# Patient Record
Sex: Male | Born: 1950 | Race: White | Hispanic: No | Marital: Married | Smoking: Never smoker
Health system: Southern US, Community
[De-identification: ages and names within clinical notes are randomized; demographics above are authoritative.]

## PROBLEM LIST (undated history)

## (undated) DIAGNOSIS — T7840XA Allergy, unspecified, initial encounter: Secondary | ICD-10-CM

## (undated) DIAGNOSIS — H3581 Retinal edema: Secondary | ICD-10-CM

## (undated) DIAGNOSIS — E785 Hyperlipidemia, unspecified: Secondary | ICD-10-CM

## (undated) DIAGNOSIS — H269 Unspecified cataract: Secondary | ICD-10-CM

## (undated) DIAGNOSIS — I10 Essential (primary) hypertension: Secondary | ICD-10-CM

## (undated) DIAGNOSIS — D649 Anemia, unspecified: Secondary | ICD-10-CM

## (undated) HISTORY — DX: Anemia, unspecified: D64.9

## (undated) HISTORY — PX: EYE SURGERY: SHX253

## (undated) HISTORY — DX: Allergy, unspecified, initial encounter: T78.40XA

## (undated) HISTORY — DX: Essential (primary) hypertension: I10

## (undated) HISTORY — DX: Unspecified cataract: H26.9

## (undated) HISTORY — DX: Retinal edema: H35.81

## (undated) HISTORY — DX: Hyperlipidemia, unspecified: E78.5

## (undated) HISTORY — PX: COLONOSCOPY: SHX174

---

## 1996-05-30 HISTORY — PX: CHOLECYSTECTOMY: SHX55

## 1997-11-04 ENCOUNTER — Emergency Department (HOSPITAL_COMMUNITY): Admission: EM | Admit: 1997-11-04 | Discharge: 1997-11-04 | Payer: Self-pay | Admitting: Emergency Medicine

## 1998-03-15 ENCOUNTER — Emergency Department (HOSPITAL_COMMUNITY): Admission: EM | Admit: 1998-03-15 | Discharge: 1998-03-15 | Payer: Self-pay

## 1998-03-24 ENCOUNTER — Observation Stay (HOSPITAL_COMMUNITY): Admission: RE | Admit: 1998-03-24 | Discharge: 1998-03-25 | Payer: Self-pay

## 2001-02-16 ENCOUNTER — Ambulatory Visit (HOSPITAL_COMMUNITY): Admission: RE | Admit: 2001-02-16 | Discharge: 2001-02-16 | Payer: Self-pay | Admitting: *Deleted

## 2001-02-16 ENCOUNTER — Encounter (INDEPENDENT_AMBULATORY_CARE_PROVIDER_SITE_OTHER): Payer: Self-pay | Admitting: *Deleted

## 2008-05-07 ENCOUNTER — Encounter: Admission: RE | Admit: 2008-05-07 | Discharge: 2008-05-07 | Payer: Self-pay | Admitting: Internal Medicine

## 2008-05-13 ENCOUNTER — Ambulatory Visit: Payer: Self-pay | Admitting: *Deleted

## 2008-07-18 ENCOUNTER — Emergency Department (HOSPITAL_COMMUNITY): Admission: EM | Admit: 2008-07-18 | Discharge: 2008-07-18 | Payer: Self-pay | Admitting: Emergency Medicine

## 2010-09-14 LAB — URINALYSIS, ROUTINE W REFLEX MICROSCOPIC
Bilirubin Urine: NEGATIVE
Glucose, UA: NEGATIVE mg/dL
Hgb urine dipstick: NEGATIVE
Nitrite: NEGATIVE
Protein, ur: NEGATIVE mg/dL
Specific Gravity, Urine: 1.021 (ref 1.005–1.030)
Urobilinogen, UA: 0.2 mg/dL (ref 0.0–1.0)
pH: 5 (ref 5.0–8.0)

## 2010-10-12 NOTE — Procedures (Signed)
CAROTID DUPLEX EXAM   INDICATION:  Left retinal occlusion.   HISTORY:  Diabetes:  Yes.  Cardiac:  No.  Hypertension:  Yes.  Smoking:  No.  Previous Surgery:  CV History:  Amaurosis Fugax No, Paresthesias No, Hemiparesis No,                                       RIGHT             LEFT  Brachial systolic pressure:         140               130  Brachial Doppler waveforms:         Biphasic          Biphasic  Vertebral direction of flow:        Antegrade         Antegrade  DUPLEX VELOCITIES (cm/sec)  CCA peak systolic                   90                82  ECA peak systolic                   142               103  ICA peak systolic                   86                58  ICA end diastolic                   28                23  PLAQUE MORPHOLOGY:                  None              None  PLAQUE AMOUNT:                      None              None  PLAQUE LOCATION:                    None              None   IMPRESSION:  1. Normal carotid duplex noted bilaterally.  2. Antegrade bilateral vertebral arteries.       ___________________________________________  P. Liliane Bade, M.D.   MG/MEDQ  D:  05/14/2008  T:  05/14/2008  Job:  [id number]

## 2011-09-27 ENCOUNTER — Encounter: Payer: Self-pay | Admitting: Internal Medicine

## 2011-11-04 ENCOUNTER — Encounter: Payer: Self-pay | Admitting: Internal Medicine

## 2011-11-04 ENCOUNTER — Ambulatory Visit (AMBULATORY_SURGERY_CENTER): Payer: PRIVATE HEALTH INSURANCE | Admitting: *Deleted

## 2011-11-04 VITALS — Ht 70.0 in | Wt 215.0 lb

## 2011-11-04 DIAGNOSIS — Z1211 Encounter for screening for malignant neoplasm of colon: Secondary | ICD-10-CM

## 2011-11-04 MED ORDER — PEG-KCL-NACL-NASULF-NA ASC-C 100 G PO SOLR: ORAL | Status: DC

## 2011-11-18 ENCOUNTER — Ambulatory Visit (AMBULATORY_SURGERY_CENTER): Payer: PRIVATE HEALTH INSURANCE | Admitting: Internal Medicine

## 2011-11-18 ENCOUNTER — Encounter: Payer: Self-pay | Admitting: Internal Medicine

## 2011-11-18 VITALS — BP 144/70 | HR 73 | Temp 96.7°F | Resp 16 | Ht 70.0 in | Wt 215.0 lb

## 2011-11-18 DIAGNOSIS — D126 Benign neoplasm of colon, unspecified: Secondary | ICD-10-CM

## 2011-11-18 DIAGNOSIS — Z1211 Encounter for screening for malignant neoplasm of colon: Secondary | ICD-10-CM

## 2011-11-18 LAB — HM COLONOSCOPY

## 2011-11-18 MED ORDER — SODIUM CHLORIDE 0.9 % IV SOLN: 500.0000 mL | INTRAVENOUS | Status: DC

## 2011-11-18 NOTE — Patient Instructions (Addendum)
YOU HAD AN ENDOSCOPIC PROCEDURE TODAY AT THE [facility]: Refer to the procedure report that was given to you for any specific questions about what was found during the examination.  If the procedure report does not answer your questions, please call your gastroenterologist to clarify.  If you requested that your care partner not be given the details of your procedure findings, then the procedure report has been included in a sealed envelope for you to review at your convenience later.  YOU SHOULD EXPECT: Some feelings of bloating in the abdomen. Passage of more gas than usual.  Walking can help get rid of the air that was put into your GI tract during the procedure and reduce the bloating. If you had a lower endoscopy (such as a colonoscopy or flexible sigmoidoscopy) you may notice spotting of blood in your stool or on the toilet paper. If you underwent a bowel prep for your procedure, then you may not have a normal bowel movement for a few days.  DIET: Your first meal following the procedure should be a light meal and then it is ok to progress to your normal diet.  A half-sandwich or bowl of soup is an example of a good first meal.  Heavy or fried foods are harder to digest and may make you feel nauseous or bloated.  Likewise meals heavy in dairy and vegetables can cause extra gas to form and this can also increase the bloating.  Drink plenty of fluids but you should avoid alcoholic beverages for 24 hours.  ACTIVITY: Your care partner should take you home directly after the procedure.  You should plan to take it easy, moving slowly for the rest of the day.  You can resume normal activity the day after the procedure however you should NOT DRIVE or use heavy machinery for 24 hours (because of the sedation medicines used during the test).    SYMPTOMS TO REPORT IMMEDIATELY: A gastroenterologist can be reached at any hour.  During normal business hours, 8:30 AM to 5:00 PM Monday through Friday,  call [phone].  After hours and on weekends, please call the GI answering service at [phone] who will take a message and have the physician on call contact you.   Following lower endoscopy (colonoscopy or flexible sigmoidoscopy):  Excessive amounts of blood in the stool  Significant tenderness or worsening of abdominal pains  Swelling of the abdomen that is new, acute  Fever of 100F or higher  Following upper endoscopy (EGD)  Vomiting of blood or coffee ground material  New chest pain or pain under the shoulder blades  Painful or persistently difficult swallowing  New shortness of breath  Fever of 100F or higher  Black, tarry-looking stools  FOLLOW UP: If any biopsies were taken you will be contacted by phone or by letter within the next 1-3 weeks.  Call your gastroenterologist if you have not heard about the biopsies in 3 weeks.  Our staff will call the home number listed on your records the next business day following your procedure to check on you and address any questions or concerns that you may have at that time regarding the information given to you following your procedure. This is a courtesy call and so if there is no answer at the home number and we have not heard from you through the emergency physician on call, we will assume that you have returned to your regular daily activities without incident.  SIGNATURES/CONFIDENTIALITY: You and/or your care   partner have signed paperwork which will be entered into your electronic medical record.  These signatures attest to the fact that that the information above on your After Visit Summary has been reviewed and is understood.  Full responsibility of the confidentiality of this discharge information lies with you and/or your care-partner.  

## 2011-11-18 NOTE — Op Note (Signed)
[facility] [address] [place], [place]  [address]  COLONOSCOPY PROCEDURE REPORT  PATIENT:  Edward Macdonald, Edward Macdonald  MR#:  [id number] BIRTHDATE:  1951/02/06, 60 yrs. old  GENDER:  male ENDOSCOPIST:  Hedwig Morton. Juanda Chance, MD REF. BY:  Juline Patch, M.D. PROCEDURE DATE:  11/18/2011 PROCEDURE:  Colonoscopy with biopsy ASA CLASS:  Class II INDICATIONS:  colorectal cancer screening, average risk prior colon about 10 years ago ? polyps? MEDICATIONS:   MAC sedation, administered by CRNA, propofol (Diprivan) 150 mg  DESCRIPTION OF PROCEDURE:   After the risks and benefits and of the procedure were explained, informed consent was obtained. Digital rectal exam was performed and revealed no rectal masses. The LB [id number] endoscope was introduced through the anus and advanced to the cecum, which was identified by both the appendix and ileocecal valve.  The quality of the prep was good, using MoviPrep.  The instrument was then slowly withdrawn as the colon was fully examined. <<PROCEDUREIMAGES>>  FINDINGS:  A sessile polyp was found in the cecum. 3 mm polyp The polyp was removed using cold biopsy forceps (see image5).  Mild diverticulosis was found in the descending colon (see image7 and image1).  This was otherwise a normal examination of the colon (see image2, image3, image4, image6, and image8).   Retroflexed views in the rectum revealed no abnormalities.    The scope was then withdrawn from the patient and the procedure completed.  COMPLICATIONS:  None ENDOSCOPIC IMPRESSION: 1) Sessile polyp in the cecum 2) Mild diverticulosis in the descending colon 3) Otherwise normal examination RECOMMENDATIONS: 1) Await pathology results 2) High fiber diet.  REPEAT EXAM:  In 5 - 10 year(s) for.  5 year recall if polyp adenomatous  ______________________________ Hedwig Morton. Juanda Chance, MD  CC:  n. eSIGNED:   Hedwig Morton. Antania Hoefling at 11/18/2011 08:29 AM  Annie Paras, [phone]

## 2011-11-18 NOTE — Progress Notes (Signed)
Patient did not experience any of the following events: a burn prior to discharge; a fall within the facility; wrong site/side/patient/procedure/implant event; or a hospital transfer or hospital admission upon discharge from the facility. (G8907) Patient did not have preoperative order for IV antibiotic SSI prophylaxis. (G8918)  

## 2011-11-21 ENCOUNTER — Telehealth: Payer: Self-pay | Admitting: *Deleted

## 2011-11-21 NOTE — Telephone Encounter (Signed)
Message left for the patient

## 2011-11-22 ENCOUNTER — Encounter: Payer: Self-pay | Admitting: Internal Medicine

## 2012-04-10 LAB — HM DIABETES EYE EXAM

## 2012-06-21 ENCOUNTER — Encounter: Payer: Self-pay | Admitting: Internal Medicine

## 2012-06-21 ENCOUNTER — Ambulatory Visit (INDEPENDENT_AMBULATORY_CARE_PROVIDER_SITE_OTHER): Payer: PRIVATE HEALTH INSURANCE | Admitting: Internal Medicine

## 2012-06-21 ENCOUNTER — Other Ambulatory Visit (INDEPENDENT_AMBULATORY_CARE_PROVIDER_SITE_OTHER): Payer: PRIVATE HEALTH INSURANCE

## 2012-06-21 VITALS — BP 128/72 | HR 83 | Temp 97.3°F | Resp 16 | Ht 70.0 in | Wt 216.5 lb

## 2012-06-21 DIAGNOSIS — E1165 Type 2 diabetes mellitus with hyperglycemia: Secondary | ICD-10-CM

## 2012-06-21 DIAGNOSIS — E118 Type 2 diabetes mellitus with unspecified complications: Secondary | ICD-10-CM | POA: Insufficient documentation

## 2012-06-21 DIAGNOSIS — E7800 Pure hypercholesterolemia, unspecified: Secondary | ICD-10-CM

## 2012-06-21 DIAGNOSIS — E1139 Type 2 diabetes mellitus with other diabetic ophthalmic complication: Secondary | ICD-10-CM

## 2012-06-21 DIAGNOSIS — E785 Hyperlipidemia, unspecified: Secondary | ICD-10-CM | POA: Insufficient documentation

## 2012-06-21 DIAGNOSIS — Z0000 Encounter for general adult medical examination without abnormal findings: Secondary | ICD-10-CM

## 2012-06-21 DIAGNOSIS — I10 Essential (primary) hypertension: Secondary | ICD-10-CM | POA: Insufficient documentation

## 2012-06-21 DIAGNOSIS — E782 Mixed hyperlipidemia: Secondary | ICD-10-CM | POA: Insufficient documentation

## 2012-06-21 DIAGNOSIS — Z23 Encounter for immunization: Secondary | ICD-10-CM

## 2012-06-21 DIAGNOSIS — R0683 Snoring: Secondary | ICD-10-CM | POA: Insufficient documentation

## 2012-06-21 DIAGNOSIS — G4730 Sleep apnea, unspecified: Secondary | ICD-10-CM

## 2012-06-21 LAB — LIPID PANEL
Cholesterol: 143 mg/dL (ref 0–200)
LDL Cholesterol: 69 mg/dL (ref 0–99)
Triglycerides: 197 mg/dL — ABNORMAL HIGH (ref 0.0–149.0)
VLDL: 39.4 mg/dL (ref 0.0–40.0)

## 2012-06-21 LAB — CBC WITH DIFFERENTIAL/PLATELET
Basophils Relative: 0.5 % (ref 0.0–3.0)
Eosinophils Relative: 2.2 % (ref 0.0–5.0)
HCT: 43.5 % (ref 39.0–52.0)
Hemoglobin: 15 g/dL (ref 13.0–17.0)
Lymphs Abs: 2 10*3/uL (ref 0.7–4.0)
Monocytes Relative: 6 % (ref 3.0–12.0)
Neutro Abs: 4 10*3/uL (ref 1.4–7.7)
RBC: 4.81 Mil/uL (ref 4.22–5.81)
WBC: 6.6 10*3/uL (ref 4.5–10.5)

## 2012-06-21 LAB — COMPREHENSIVE METABOLIC PANEL
Albumin: 4.2 g/dL (ref 3.5–5.2)
BUN: 17 mg/dL (ref 6–23)
CO2: 28 mEq/L (ref 19–32)
Calcium: 9.9 mg/dL (ref 8.4–10.5)
Chloride: 103 mEq/L (ref 96–112)
Glucose, Bld: 114 mg/dL — ABNORMAL HIGH (ref 70–99)
Potassium: 4.1 mEq/L (ref 3.5–5.1)
Sodium: 139 mEq/L (ref 135–145)
Total Protein: 7.3 g/dL (ref 6.0–8.3)

## 2012-06-21 LAB — PSA: PSA: 0.28 ng/mL (ref 0.10–4.00)

## 2012-06-21 LAB — FECAL OCCULT BLOOD, GUAIAC: Fecal Occult Blood: NEGATIVE

## 2012-06-21 LAB — URINALYSIS, ROUTINE W REFLEX MICROSCOPIC
Bilirubin Urine: NEGATIVE
Leukocytes, UA: NEGATIVE
Specific Gravity, Urine: 1.015 (ref 1.000–1.030)
Urine Glucose: NEGATIVE
Urobilinogen, UA: 0.2 (ref 0.0–1.0)

## 2012-06-21 LAB — MICROALBUMIN / CREATININE URINE RATIO
Microalb Creat Ratio: 0.2 mg/g (ref 0.0–30.0)
Microalb, Ur: 0.2 mg/dL (ref 0.0–1.9)

## 2012-06-21 LAB — HEMOGLOBIN A1C: Hgb A1c MFr Bld: 7.1 % — ABNORMAL HIGH (ref 4.6–6.5)

## 2012-06-21 LAB — HM DIABETES FOOT EXAM: HM Diabetic Foot Exam: NORMAL

## 2012-06-21 LAB — TSH: TSH: 1.92 u[IU]/mL (ref 0.35–5.50)

## 2012-06-21 MED ORDER — METFORMIN HCL 500 MG PO TABS: 500.0000 mg | ORAL_TABLET | Freq: Two times a day (BID) | ORAL | Status: DC

## 2012-06-21 MED ORDER — INSULIN PEN NEEDLE 31G X 8 MM MISC: 1.0000 | Freq: Three times a day (TID) | Status: DC

## 2012-06-21 MED ORDER — VALSARTAN-HYDROCHLOROTHIAZIDE 160-12.5 MG PO TABS: 1.0000 | ORAL_TABLET | Freq: Every day | ORAL | Status: DC

## 2012-06-21 MED ORDER — ONETOUCH VERIO IQ SYSTEM W/DEVICE KIT: 1.0000 | PACK | Freq: Three times a day (TID) | Status: DC

## 2012-06-21 MED ORDER — BISOPROLOL FUMARATE 5 MG PO TABS: 5.0000 mg | ORAL_TABLET | Freq: Every day | ORAL | Status: DC

## 2012-06-21 MED ORDER — ROSUVASTATIN CALCIUM 10 MG PO TABS: 10.0000 mg | ORAL_TABLET | Freq: Every day | ORAL | Status: DC

## 2012-06-21 MED ORDER — INSULIN ASPART PROT & ASPART (70-30 MIX) 100 UNIT/ML ~~LOC~~ SUSP: 60.0000 [IU] | Freq: Two times a day (BID) | SUBCUTANEOUS | Status: DC

## 2012-06-21 MED ORDER — GLUCOSE BLOOD VI STRP: ORAL_STRIP | Status: DC

## 2012-06-21 NOTE — Addendum Note (Signed)
Addended by: Etta Grandchild on: 06/21/2012 02:13 PM   Modules accepted: Orders

## 2012-06-21 NOTE — Assessment & Plan Note (Signed)
I will check his a1c and will make changes in his meds of needed I will check his renal function He will start a statin

## 2012-06-21 NOTE — Assessment & Plan Note (Addendum)
Exam done His EKG is normal Vaccines were reviewed and updated Labs ordered Pt ed material was given

## 2012-06-21 NOTE — Assessment & Plan Note (Signed)
Referral to sleep med at his request

## 2012-06-21 NOTE — Patient Instructions (Signed)
Diabetes, Type 2 Diabetes is a long-lasting (chronic) disease. In type 2 diabetes, the pancreas does not make enough insulin (a hormone), and the body does not respond normally to the insulin that is made. This type of diabetes was also previously called adult-onset diabetes. It usually occurs after the age of 40, but it can occur at any age.  CAUSES  Type 2 diabetes happens because the pancreasis not making enough insulin or your body has trouble using the insulin that your pancreas does make properly. SYMPTOMS   Drinking more than usual.  Urinating more than usual.  Blurred vision.  Dry, itchy skin.  Frequent infections.  Feeling more tired than usual (fatigue). DIAGNOSIS The diagnosis of type 2 diabetes is usually made by one of the following tests:  Fasting blood glucose test. You will not eat for at least 8 hours and then take a blood test.  Random blood glucose test. Your blood glucose (sugar) is checked at any time of the day regardless of when you ate.  Oral glucose tolerance test (OGTT). Your blood glucose is measured after you have not eaten (fasted) and then after you drink a glucose containing beverage. TREATMENT   Healthy eating.  Exercise.  Medicine, if needed.  Monitoring blood glucose.  Seeing your caregiver regularly. HOME CARE INSTRUCTIONS   Check your blood glucose at least once a day. More frequent monitoring may be necessary, depending on your medicines and on how well your diabetes is controlled. Your caregiver will advise you.  Take your medicine as directed by your caregiver.  Do not smoke.  Make wise food choices. Ask your caregiver for information. Weight loss can improve your diabetes.  Learn about low blood glucose (hypoglycemia) and how to treat it.  Get your eyes checked regularly.  Have a yearly physical exam. Have your blood pressure checked and your blood and urine tested.  Wear a pendant or bracelet saying that you have  diabetes.  Check your feet every night for cuts, sores, blisters, and redness. Let your caregiver know if you have any problems. SEEK MEDICAL CARE IF:   You have problems keeping your blood glucose in target range.  You have problems with your medicines.  You have symptoms of an illness that do not improve after 24 hours.  You have a sore or wound that is not healing.  You notice a change in vision or a new problem with your vision.  You have a fever. MAKE SURE YOU:  Understand these instructions.  Will watch your condition.  Will get help right away if you are not doing well or get worse. Document Released: 05/16/2005 Document Revised: 08/08/2011 Document Reviewed: 11/01/2010 Exit[employer] Patient Information 2013 [employer]. Health Maintenance, Males A healthy lifestyle and preventative care can promote health and wellness.  Maintain regular health, dental, and eye exams.  Eat a healthy diet. Foods like vegetables, fruits, whole grains, low-fat dairy products, and lean protein foods contain the nutrients you need without too many calories. Decrease your intake of foods high in solid fats, added sugars, and salt. Get information about a proper diet from your caregiver, if necessary.  Regular physical exercise is one of the most important things you can do for your health. Most adults should get at least 150 minutes of moderate-intensity exercise (any activity that increases your heart rate and causes you to sweat) each week. In addition, most adults need muscle-strengthening exercises on 2 or more days a week.   Maintain a healthy weight. The   body mass index (BMI) is a screening tool to identify possible weight problems. It provides an estimate of body fat based on height and weight. Your caregiver can help determine your BMI, and can help you achieve or maintain a healthy weight. For adults 20 years and older:  A BMI below 18.5 is considered underweight.  A BMI of 18.5 to  24.9 is normal.  A BMI of 25 to 29.9 is considered overweight.  A BMI of 30 and above is considered obese.  Maintain normal blood lipids and cholesterol by exercising and minimizing your intake of saturated fat. Eat a balanced diet with plenty of fruits and vegetables. Blood tests for lipids and cholesterol should begin at age 20 and be repeated every 5 years. If your lipid or cholesterol levels are high, you are over 50, or you are a high risk for heart disease, you may need your cholesterol levels checked more frequently.Ongoing high lipid and cholesterol levels should be treated with medicines, if diet and exercise are not effective.  If you smoke, find out from your caregiver how to quit. If you do not use tobacco, do not start.  If you choose to drink alcohol, do not exceed 2 drinks per day. One drink is considered to be 12 ounces (355 mL) of beer, 5 ounces (148 mL) of wine, or 1.5 ounces (44 mL) of liquor.  Avoid use of street drugs. Do not share needles with anyone. Ask for help if you need support or instructions about stopping the use of drugs.  High blood pressure causes heart disease and increases the risk of stroke. Blood pressure should be checked at least every 1 to 2 years. Ongoing high blood pressure should be treated with medicines if weight loss and exercise are not effective.  If you are 45 to 62 years old, ask your caregiver if you should take aspirin to prevent heart disease.  Diabetes screening involves taking a blood sample to check your fasting blood sugar level. This should be done once every 3 years, after age 45, if you are within normal weight and without risk factors for diabetes. Testing should be considered at a younger age or be carried out more frequently if you are overweight and have at least 1 risk factor for diabetes.  Colorectal cancer can be detected and often prevented. Most routine colorectal cancer screening begins at the age of 50 and continues through  age 75. However, your caregiver may recommend screening at an earlier age if you have risk factors for colon cancer. On a yearly basis, your caregiver may provide home test kits to check for hidden blood in the stool. Use of a small camera at the end of a tube, to directly examine the colon (sigmoidoscopy or colonoscopy), can detect the earliest forms of colorectal cancer. Talk to your caregiver about this at age 50, when routine screening begins. Direct examination of the colon should be repeated every 5 to 10 years through age 75, unless early forms of pre-cancerous polyps or small growths are found.  Hepatitis C blood testing is recommended for all people born from 1945 through 1965 and any individual with known risks for hepatitis C.  Healthy men should no longer receive prostate-specific antigen (PSA) blood tests as part of routine cancer screening. Consult with your caregiver about prostate cancer screening.  Testicular cancer screening is not recommended for adolescents or adult males who have no symptoms. Screening includes self-exam, caregiver exam, and other screening tests. Consult with your caregiver   about any symptoms you have or any concerns you have about testicular cancer.  Practice safe sex. Use condoms and avoid high-risk sexual practices to reduce the spread of sexually transmitted infections (STIs).  Use sunscreen with a sun protection factor (SPF) of 30 or greater. Apply sunscreen liberally and repeatedly throughout the day. You should seek shade when your shadow is shorter than you. Protect yourself by wearing long sleeves, pants, a wide-brimmed hat, and sunglasses year round, whenever you are outdoors.  Notify your caregiver of new moles or changes in moles, especially if there is a change in shape or color. Also notify your caregiver if a mole is larger than the size of a pencil eraser.  A one-time screening for abdominal aortic aneurysm (AAA) and surgical repair of large AAAs  by sound wave imaging (ultrasonography) is recommended for ages 65 to 75 years who are current or former smokers.  Stay current with your immunizations. Document Released: 11/12/2007 Document Revised: 08/08/2011 Document Reviewed: 10/11/2010 Exit[employer] Patient Information 2013 [employer].  

## 2012-06-21 NOTE — Assessment & Plan Note (Signed)
He agrees to start crestor

## 2012-06-21 NOTE — Assessment & Plan Note (Signed)
His BP is well controlled I will check his lytes and renal function today 

## 2012-06-21 NOTE — Progress Notes (Signed)
Subjective:    Patient ID: Edward Macdonald, male    DOB: [birth year], 62 y.o.   MRN: [id number]  Diabetes He presents for his follow-up diabetic visit. He has type 2 diabetes mellitus. The initial diagnosis of diabetes was made 18 years ago. His disease course has been fluctuating. There are no hypoglycemic associated symptoms. Pertinent negatives for hypoglycemia include no dizziness, headaches, seizures, speech difficulty or tremors. Pertinent negatives for diabetes include no blurred vision, no chest pain, no fatigue, no foot paresthesias, no foot ulcerations, no polydipsia, no polyphagia, no polyuria, no visual change, no weakness and no weight loss. There are no hypoglycemic complications. There are no diabetic complications. Current diabetic treatment includes oral agent (monotherapy), intensive insulin program and insulin injections. He is compliant with treatment most of the time. His weight is stable. He is following a generally healthy diet. Meal planning includes avoidance of concentrated sweets. He never participates in exercise. His breakfast blood glucose range is generally 110-130 mg/dl. His lunch blood glucose range is generally 140-180 mg/dl. His dinner blood glucose range is generally 180-200 mg/dl. His highest blood glucose is 180-200 mg/dl. His overall blood glucose range is 180-200 mg/dl. An ACE inhibitor/angiotensin II receptor blocker is being taken. He does not see a podiatrist.Eye exam is current.      Review of Systems  Constitutional: Negative for fever, chills, weight loss, diaphoresis, activity change, appetite change, fatigue and unexpected weight change.  HENT: Negative.   Eyes: Negative.  Negative for blurred vision.  Respiratory: Positive for apnea. Negative for cough, choking, chest tightness, shortness of breath, wheezing and stridor.   Cardiovascular: Negative for chest pain, palpitations and leg swelling.  Gastrointestinal: Negative for nausea, vomiting, abdominal  pain, diarrhea, constipation and blood in stool.  Genitourinary: Negative for dysuria, urgency, polyuria, frequency, hematuria, flank pain, decreased urine volume, discharge, penile swelling, scrotal swelling, enuresis, difficulty urinating, genital sores, penile pain and testicular pain.  Musculoskeletal: Negative for myalgias, back pain, joint swelling, arthralgias and gait problem.  Skin: Negative.   Neurological: Positive for numbness (in both feet). Negative for dizziness, tremors, seizures, syncope, facial asymmetry, speech difficulty, weakness, light-headedness and headaches.  Hematological: Negative for polydipsia, polyphagia and adenopathy. Does not bruise/bleed easily.  Psychiatric/Behavioral: Negative.        Objective:   Physical Exam  Vitals reviewed. Constitutional: He is oriented to person, place, and time. He appears well-developed and well-nourished. No distress.  HENT:  Head: Normocephalic and atraumatic.  Mouth/Throat: Oropharynx is clear and moist. No oropharyngeal exudate.  Eyes: Conjunctivae normal are normal. Right eye exhibits no discharge. Left eye exhibits no discharge. No scleral icterus.  Neck: Normal range of motion. Neck supple. No JVD present. No tracheal deviation present. No thyromegaly present.  Cardiovascular: Normal rate, regular rhythm, normal heart sounds and intact distal pulses.  Exam reveals no gallop and no friction rub.   No murmur heard. Pulmonary/Chest: Effort normal and breath sounds normal. No stridor. No respiratory distress. He has no wheezes. He has no rales. He exhibits no tenderness.  Abdominal: Soft. Bowel sounds are normal. He exhibits no distension and no mass. There is no tenderness. There is no rebound and no guarding. Hernia confirmed negative in the right inguinal area and confirmed negative in the left inguinal area.  Genitourinary: Rectum normal, prostate normal, testes normal and penis normal. Rectal exam shows no external  hemorrhoid, no internal hemorrhoid, no fissure, no mass, no tenderness and anal tone normal. Guaiac negative stool. Prostate is not enlarged and  not tender. Right testis shows no mass, no swelling and no tenderness. Right testis is descended. Left testis shows no mass, no swelling and no tenderness. Left testis is descended. Circumcised. No penile erythema or penile tenderness. No discharge found.  Musculoskeletal: Normal range of motion. He exhibits no edema and no tenderness.  Lymphadenopathy:    He has no cervical adenopathy.       Right: No inguinal adenopathy present.       Left: No inguinal adenopathy present.  Neurological: He is alert and oriented to person, place, and time. He has normal reflexes. He displays normal reflexes. No cranial nerve deficit. He exhibits normal muscle tone. Coordination normal.  Skin: Skin is warm and dry. No rash noted. He is not diaphoretic. No erythema. No pallor.  Psychiatric: He has a normal mood and affect. His behavior is normal. Judgment and thought content normal.      No results found for this basename: WBC, HGB, HCT, PLT, GLUCOSE, CHOL, TRIG, HDL, LDLDIRECT, LDLCALC, ALT, AST, NA, K, CL, CREATININE, BUN, CO2, TSH, PSA, INR, GLUF, HGBA1C, MICROALBUR      Assessment & Plan:

## 2012-07-05 ENCOUNTER — Ambulatory Visit (INDEPENDENT_AMBULATORY_CARE_PROVIDER_SITE_OTHER): Payer: PRIVATE HEALTH INSURANCE | Admitting: Pulmonary Disease

## 2012-07-05 ENCOUNTER — Encounter: Payer: Self-pay | Admitting: Pulmonary Disease

## 2012-07-05 VITALS — BP 122/78 | HR 80 | Temp 98.2°F | Ht 70.0 in | Wt 221.0 lb

## 2012-07-05 DIAGNOSIS — R0683 Snoring: Secondary | ICD-10-CM

## 2012-07-05 DIAGNOSIS — R0989 Other specified symptoms and signs involving the circulatory and respiratory systems: Secondary | ICD-10-CM

## 2012-07-05 DIAGNOSIS — G4730 Sleep apnea, unspecified: Secondary | ICD-10-CM

## 2012-07-05 NOTE — Assessment & Plan Note (Signed)
The patient has a retinal disease that may be influenced by nocturnal hypoxemia, and therefore the question is been raised whether he may have sleep disordered breathing.  The patient is overweight with a large neck size, but really doesn't give a convincing history for sleep apnea.  The first question we really need to answer here is whether he is having nocturnal desaturation, and if not, I do not think I would pursue sleep disordered breathing any further unless he is having worsening symptoms (however, a normal ONO does not rule out the presence of OSA).  On the other hand, if he is having significant desaturations, I would proceed with sleep study for further evaluation.  The patient is agreeable to this approach.  I have encouraged him to work aggressively on weight loss.

## 2012-07-05 NOTE — Progress Notes (Signed)
  Subjective:    Patient ID: Edward Macdonald, male    DOB: Aug 09, 1950, 62 y.o.   MRN: [id number]  HPI The patient is a 62 year old male who I've been asked to see for possible obstructive sleep apnea.  The patient has a degenerative retinal disease, and the question was raised whether nocturnal hypoxemia could be contributing to this.  This led to the question of possible sleep apnea.  The patient has had some snoring noted, but no one has commented on an abnormal breathing pattern during sleep.  He denies any gasping arousals.  The patient does not have significant awakenings during the night, and feels rested in the mornings upon arising.  He denies any issues with sleepiness during the day, although he can have sleep pressure after lunch less than one time a week.  He has some sleepiness in the evening while watching television, but this is not consistent.  He has no issues with sleepiness while driving.  The patient states that his weight is up 5 pounds over the past 2 years, and his Epworth score today is only 7.  Sleep Questionnaire: What time do you typically go to bed?( Between what hours) 12a How long does it take you to fall asleep? 5 min How many times during the night do you wake up? 2 What time do you get out of bed to start your day? 0830 Do you drive or operate heavy machinery in your occupation? No How much has your weight changed (up or down) over the past two years? (In pounds) 0 oz (0 kg) Have you ever had a sleep study before? No Do you currently use CPAP? No Do you wear oxygen at any time? No    Review of Systems  Constitutional: Negative for fever and unexpected weight change.  HENT: Positive for rhinorrhea ( with eating). Negative for ear pain, nosebleeds, congestion, sore throat, sneezing, trouble swallowing, dental problem, postnasal drip and sinus pressure.   Eyes: Negative for redness and itching.  Respiratory: Negative for cough, chest tightness, shortness of breath and  wheezing.   Cardiovascular: Negative for palpitations and leg swelling.  Gastrointestinal: Negative for nausea and vomiting.  Genitourinary: Negative for dysuria.  Musculoskeletal: Negative for joint swelling.  Skin: Negative for rash.  Neurological: Negative for headaches.  Hematological: Does not bruise/bleed easily.  Psychiatric/Behavioral: Negative for dysphoric mood. The patient is not nervous/anxious.        Objective:   Physical Exam Constitutional:  Well developed, no acute distress  HENT:  Nares patent without discharge  Oropharynx without exudate, palate is normal, uvula mildly elongated.   Eyes:  Perrla, eomi, no scleral icterus  Neck:  No JVD, no TMG  Cardiovascular:  Normal rate, regular rhythm, no rubs or gallops.  No murmurs        Intact distal pulses  Pulmonary :  Normal breath sounds, no stridor or respiratory distress   No rales, rhonchi, or wheezing  Abdominal:  Soft, nondistended, bowel sounds present.  No tenderness noted.   Musculoskeletal:  mild lower extremity edema noted.  Lymph Nodes:  No cervical lymphadenopathy noted  Skin:  No cyanosis noted  Neurologic:  Alert, appropriate, moves all 4 extremities without obvious deficit.         Assessment & Plan:

## 2012-07-05 NOTE — Patient Instructions (Addendum)
Will check overnight oxygen levels first to see if you drop during sleep.  Will call you with results. If you do not have significant decreases, no further workup required.  If you do drop significantly, you will need a sleep study.  Work on weight loss, since this is the most effective treatment for snoring and sleep apnea.

## 2012-07-14 ENCOUNTER — Other Ambulatory Visit: Payer: Self-pay

## 2012-07-18 ENCOUNTER — Telehealth: Payer: Self-pay

## 2012-07-18 NOTE — Telephone Encounter (Signed)
Patient called [facility] stating that insurance will not cover onetouch supplies. He is requesting refill for old freestyle lite supplies

## 2012-07-19 ENCOUNTER — Encounter: Payer: Self-pay | Admitting: Internal Medicine

## 2012-07-20 MED ORDER — GLUCOSE BLOOD VI STRP: ORAL_STRIP | Status: DC

## 2012-07-30 ENCOUNTER — Telehealth: Payer: Self-pay | Admitting: *Deleted

## 2012-07-30 NOTE — Telephone Encounter (Signed)
Patients ONO results received and placed in green folder for your review.

## 2012-07-31 ENCOUNTER — Telehealth: Payer: Self-pay | Admitting: Pulmonary Disease

## 2012-07-31 NOTE — Telephone Encounter (Signed)
Please let pt know that his oxygen level was only below our acceptable level 16 seconds during the whole night.  Does not require oxygen and probably not related to sleep apnea.  Would not do sleep study unless you are having symptoms of sleepiness and ineffective sleep.

## 2012-07-31 NOTE — Telephone Encounter (Signed)
[facility] x 1 

## 2012-08-01 NOTE — Telephone Encounter (Signed)
Pt returned call. Call him back at [phone]. Hazel Sams

## 2012-08-07 NOTE — Telephone Encounter (Signed)
Patient aware of of results per [facility].

## 2012-08-15 NOTE — Telephone Encounter (Signed)
Duplicate message--results have been addresses with pt.

## 2012-08-22 ENCOUNTER — Encounter: Payer: Self-pay | Admitting: Pulmonary Disease

## 2012-10-09 ENCOUNTER — Ambulatory Visit (INDEPENDENT_AMBULATORY_CARE_PROVIDER_SITE_OTHER): Payer: PRIVATE HEALTH INSURANCE | Admitting: Internal Medicine

## 2012-10-09 ENCOUNTER — Encounter: Payer: Self-pay | Admitting: Internal Medicine

## 2012-10-09 ENCOUNTER — Other Ambulatory Visit (INDEPENDENT_AMBULATORY_CARE_PROVIDER_SITE_OTHER): Payer: PRIVATE HEALTH INSURANCE

## 2012-10-09 VITALS — BP 128/80 | HR 67 | Temp 97.7°F | Resp 16 | Wt 217.5 lb

## 2012-10-09 DIAGNOSIS — E7800 Pure hypercholesterolemia, unspecified: Secondary | ICD-10-CM

## 2012-10-09 DIAGNOSIS — E1165 Type 2 diabetes mellitus with hyperglycemia: Secondary | ICD-10-CM

## 2012-10-09 DIAGNOSIS — E1139 Type 2 diabetes mellitus with other diabetic ophthalmic complication: Secondary | ICD-10-CM

## 2012-10-09 DIAGNOSIS — I10 Essential (primary) hypertension: Secondary | ICD-10-CM

## 2012-10-09 LAB — COMPREHENSIVE METABOLIC PANEL
ALT: 26 U/L (ref 0–53)
AST: 22 U/L (ref 0–37)
Albumin: 3.7 g/dL (ref 3.5–5.2)
Alkaline Phosphatase: 49 U/L (ref 39–117)
BUN: 20 mg/dL (ref 6–23)
Potassium: 4 mEq/L (ref 3.5–5.1)

## 2012-10-09 LAB — HEMOGLOBIN A1C: Hgb A1c MFr Bld: 7.8 % — ABNORMAL HIGH (ref 4.6–6.5)

## 2012-10-09 MED ORDER — ROSUVASTATIN CALCIUM 10 MG PO TABS: 10.0000 mg | ORAL_TABLET | Freq: Every day | ORAL | Status: DC

## 2012-10-09 NOTE — Patient Instructions (Signed)

## 2012-10-09 NOTE — Progress Notes (Signed)
Subjective:    Patient ID: Edward Macdonald, male    DOB: August 11, 1950, 62 y.o.   MRN: [id number]  Diabetes He presents for his follow-up diabetic visit. He has type 2 diabetes mellitus. His disease course has been stable. There are no hypoglycemic associated symptoms. Pertinent negatives for hypoglycemia include no dizziness or tremors. Pertinent negatives for diabetes include no blurred vision, no chest pain, no fatigue, no foot paresthesias, no foot ulcerations, no polydipsia, no polyphagia, no polyuria, no visual change, no weakness and no weight loss. There are no hypoglycemic complications. There are no diabetic complications. Current diabetic treatment includes insulin injections and oral agent (monotherapy). He is compliant with treatment most of the time. His weight is stable. He is following a generally healthy diet. Meal planning includes avoidance of concentrated sweets. There is no change in his home blood glucose trend. An ACE inhibitor/angiotensin II receptor blocker is being taken. He does not see a podiatrist.Eye exam is current.      Review of Systems  Constitutional: Negative.  Negative for weight loss and fatigue.  HENT: Negative.   Eyes: Negative.  Negative for blurred vision.  Respiratory: Negative.  Negative for cough, chest tightness, shortness of breath, wheezing and stridor.   Cardiovascular: Negative.  Negative for chest pain, palpitations and leg swelling.  Gastrointestinal: Negative.  Negative for nausea, vomiting, abdominal pain, diarrhea, constipation and blood in stool.  Endocrine: Negative.  Negative for polydipsia, polyphagia and polyuria.  Genitourinary: Negative.  Negative for flank pain and enuresis.  Musculoskeletal: Negative.   Skin: Negative.   Allergic/Immunologic: Negative.   Neurological: Negative.  Negative for dizziness, tremors, weakness and light-headedness.  Hematological: Negative.  Negative for adenopathy. Does not bruise/bleed easily.   Psychiatric/Behavioral: Negative.        Objective:   Physical Exam  Vitals reviewed. Constitutional: He is oriented to person, place, and time. He appears well-developed and well-nourished. No distress.  HENT:  Head: Normocephalic and atraumatic.  Mouth/Throat: Oropharynx is clear and moist. No oropharyngeal exudate.  Eyes: Conjunctivae are normal. Right eye exhibits no discharge. Left eye exhibits no discharge. No scleral icterus.  Neck: Normal range of motion. Neck supple. No JVD present. No tracheal deviation present. No thyromegaly present.  Cardiovascular: Normal rate, regular rhythm, normal heart sounds and intact distal pulses.  Exam reveals no gallop and no friction rub.   No murmur heard. Pulmonary/Chest: Effort normal and breath sounds normal. No stridor. No respiratory distress. He has no wheezes. He has no rales. He exhibits no tenderness.  Abdominal: Soft. Bowel sounds are normal. He exhibits no distension and no mass. There is no tenderness. There is no rebound and no guarding.  Musculoskeletal: Normal range of motion. He exhibits no edema and no tenderness.  Lymphadenopathy:    He has no cervical adenopathy.  Neurological: He is oriented to person, place, and time.  Skin: Skin is warm and dry. No rash noted. He is not diaphoretic. No erythema. No pallor.  Psychiatric: He has a normal mood and affect. His behavior is normal. Judgment and thought content normal.     Lab Results  Component Value Date   WBC 6.6 06/21/2012   HGB 15.0 06/21/2012   HCT 43.5 06/21/2012   PLT 276.0 06/21/2012   GLUCOSE 114* 06/21/2012   CHOL 143 06/21/2012   TRIG 197.0* 06/21/2012   HDL 34.60* 06/21/2012   LDLCALC 69 06/21/2012   ALT 31 06/21/2012   AST 24 06/21/2012   NA 139 06/21/2012   K  4.1 06/21/2012   CL 103 06/21/2012   CREATININE 1.1 06/21/2012   BUN 17 06/21/2012   CO2 28 06/21/2012   TSH 1.92 06/21/2012   PSA 0.28 06/21/2012   HGBA1C 7.1* 06/21/2012   MICROALBUR 0.2 06/21/2012        Assessment & Plan:

## 2012-10-11 NOTE — Assessment & Plan Note (Signed)
He is doing well on crestor 

## 2012-10-11 NOTE — Assessment & Plan Note (Signed)
His BP is well controlled Today I will check his lytes and renal function 

## 2012-10-11 NOTE — Assessment & Plan Note (Signed)
I will check his a1c and will monitor his renal function 

## 2012-10-12 ENCOUNTER — Other Ambulatory Visit: Payer: Self-pay | Admitting: Internal Medicine

## 2013-01-11 ENCOUNTER — Other Ambulatory Visit: Payer: Self-pay | Admitting: Internal Medicine

## 2013-02-14 ENCOUNTER — Other Ambulatory Visit: Payer: Self-pay | Admitting: Internal Medicine

## 2013-04-04 ENCOUNTER — Other Ambulatory Visit: Payer: Self-pay

## 2013-04-12 ENCOUNTER — Other Ambulatory Visit: Payer: Self-pay | Admitting: Internal Medicine

## 2013-04-15 ENCOUNTER — Other Ambulatory Visit: Payer: Self-pay

## 2013-04-15 MED ORDER — VALSARTAN-HYDROCHLOROTHIAZIDE 160-12.5 MG PO TABS: 1.0000 | ORAL_TABLET | Freq: Every day | ORAL | Status: DC

## 2013-05-15 ENCOUNTER — Other Ambulatory Visit: Payer: Self-pay | Admitting: Internal Medicine

## 2013-05-21 LAB — HM DIABETES EYE EXAM

## 2013-06-18 ENCOUNTER — Encounter: Payer: Self-pay | Admitting: Internal Medicine

## 2013-06-18 ENCOUNTER — Ambulatory Visit (INDEPENDENT_AMBULATORY_CARE_PROVIDER_SITE_OTHER): Payer: PRIVATE HEALTH INSURANCE | Admitting: Internal Medicine

## 2013-06-18 ENCOUNTER — Other Ambulatory Visit (INDEPENDENT_AMBULATORY_CARE_PROVIDER_SITE_OTHER): Payer: PRIVATE HEALTH INSURANCE

## 2013-06-18 VITALS — BP 128/72 | HR 64 | Temp 97.8°F | Resp 16 | Ht 70.0 in | Wt 225.0 lb

## 2013-06-18 DIAGNOSIS — I10 Essential (primary) hypertension: Secondary | ICD-10-CM

## 2013-06-18 DIAGNOSIS — E1139 Type 2 diabetes mellitus with other diabetic ophthalmic complication: Secondary | ICD-10-CM

## 2013-06-18 DIAGNOSIS — E1165 Type 2 diabetes mellitus with hyperglycemia: Principal | ICD-10-CM

## 2013-06-18 DIAGNOSIS — E7800 Pure hypercholesterolemia, unspecified: Secondary | ICD-10-CM

## 2013-06-18 DIAGNOSIS — Z23 Encounter for immunization: Secondary | ICD-10-CM

## 2013-06-18 LAB — URINALYSIS, ROUTINE W REFLEX MICROSCOPIC
Bilirubin Urine: NEGATIVE
Hgb urine dipstick: NEGATIVE
Ketones, ur: NEGATIVE
LEUKOCYTES UA: NEGATIVE
Nitrite: NEGATIVE
PH: 5.5 (ref 5.0–8.0)
SPECIFIC GRAVITY, URINE: 1.025 (ref 1.000–1.030)
Total Protein, Urine: NEGATIVE
URINE GLUCOSE: NEGATIVE
Urobilinogen, UA: 0.2 (ref 0.0–1.0)

## 2013-06-18 LAB — TSH: TSH: 2.66 u[IU]/mL (ref 0.35–5.50)

## 2013-06-18 LAB — CBC WITH DIFFERENTIAL/PLATELET
BASOS ABS: 0 10*3/uL (ref 0.0–0.1)
Basophils Relative: 0.5 % (ref 0.0–3.0)
EOS ABS: 0.3 10*3/uL (ref 0.0–0.7)
Eosinophils Relative: 4.4 % (ref 0.0–5.0)
HCT: 43.8 % (ref 39.0–52.0)
Hemoglobin: 14.9 g/dL (ref 13.0–17.0)
Lymphocytes Relative: 34 % (ref 12.0–46.0)
Lymphs Abs: 2.5 10*3/uL (ref 0.7–4.0)
MCHC: 34.1 g/dL (ref 30.0–36.0)
MCV: 91.7 fl (ref 78.0–100.0)
Monocytes Absolute: 0.5 10*3/uL (ref 0.1–1.0)
Monocytes Relative: 7.2 % (ref 3.0–12.0)
NEUTROS PCT: 53.9 % (ref 43.0–77.0)
Neutro Abs: 3.9 10*3/uL (ref 1.4–7.7)
Platelets: 244 10*3/uL (ref 150.0–400.0)
RBC: 4.78 Mil/uL (ref 4.22–5.81)
RDW: 13.9 % (ref 11.5–14.6)
WBC: 7.3 10*3/uL (ref 4.5–10.5)

## 2013-06-18 LAB — COMPREHENSIVE METABOLIC PANEL
ALBUMIN: 4.2 g/dL (ref 3.5–5.2)
ALT: 39 U/L (ref 0–53)
AST: 31 U/L (ref 0–37)
Alkaline Phosphatase: 45 U/L (ref 39–117)
BUN: 16 mg/dL (ref 6–23)
CALCIUM: 9.9 mg/dL (ref 8.4–10.5)
CO2: 31 mEq/L (ref 19–32)
Chloride: 104 mEq/L (ref 96–112)
Creatinine, Ser: 1 mg/dL (ref 0.4–1.5)
GFR: 77.69 mL/min (ref >60.00)
Glucose, Bld: 76 mg/dL (ref 70–99)
Potassium: 4 mEq/L (ref 3.5–5.1)
Sodium: 142 mEq/L (ref 135–145)
Total Bilirubin: 0.5 mg/dL (ref 0.3–1.2)
Total Protein: 7.7 g/dL (ref 6.0–8.3)

## 2013-06-18 LAB — LIPID PANEL
CHOLESTEROL: 90 mg/dL (ref 0–200)
HDL: 35.2 mg/dL — ABNORMAL LOW (ref >39.00)
LDL CALC: 23 mg/dL (ref 0–99)
Total CHOL/HDL Ratio: 3
Triglycerides: 158 mg/dL — ABNORMAL HIGH (ref 0.0–149.0)
VLDL: 31.6 mg/dL (ref 0.0–40.0)

## 2013-06-18 LAB — HEMOGLOBIN A1C: Hgb A1c MFr Bld: 8 % — ABNORMAL HIGH (ref 4.6–6.5)

## 2013-06-18 MED ORDER — CANAGLIFLOZIN 300 MG PO TABS
1.0000 | ORAL_TABLET | Freq: Every day | ORAL | Status: DC
Start: 2013-06-18 — End: 2014-04-14

## 2013-06-18 NOTE — Assessment & Plan Note (Signed)
His LDL goal has been achieved

## 2013-06-18 NOTE — Assessment & Plan Note (Signed)
His BP is well controlled 

## 2013-06-18 NOTE — Assessment & Plan Note (Signed)
His A1C is up to 8% so I have asked him to start [place] He will cont metformin and insulin

## 2013-06-18 NOTE — Progress Notes (Signed)
Subjective:    Patient ID: Edward Macdonald, male    DOB: [birth year], 63 y.o.   MRN: [id number]  Diabetes He presents for his follow-up diabetic visit. He has type 2 diabetes mellitus. His disease course has been fluctuating. There are no hypoglycemic associated symptoms. Associated symptoms include polydipsia, polyphagia and polyuria. Pertinent negatives for diabetes include no blurred vision, no chest pain, no fatigue, no foot paresthesias, no foot ulcerations, no visual change, no weakness and no weight loss. There are no hypoglycemic complications. Symptoms are stable. Diabetic complications include peripheral neuropathy and retinopathy. Current diabetic treatment includes oral agent (monotherapy) and insulin injections. His weight is stable. He is following a generally healthy diet. Meal planning includes avoidance of concentrated sweets. He never participates in exercise. His breakfast blood glucose range is generally 140-180 mg/dl. His lunch blood glucose range is generally 180-200 mg/dl. His dinner blood glucose range is generally 180-200 mg/dl. His highest blood glucose is 140-180 mg/dl. His overall blood glucose range is 180-200 mg/dl. An ACE inhibitor/angiotensin II receptor blocker is being taken. He does not see a podiatrist.Eye exam is current.      Review of Systems  Constitutional: Negative.  Negative for fever, chills, weight loss, diaphoresis, appetite change and fatigue.  HENT: Negative.   Eyes: Negative.  Negative for blurred vision.  Respiratory: Negative.  Negative for cough, chest tightness, shortness of breath, wheezing and stridor.   Cardiovascular: Negative.  Negative for chest pain, palpitations and leg swelling.  Gastrointestinal: Negative.  Negative for nausea, vomiting, abdominal pain, diarrhea and constipation.  Endocrine: Positive for polydipsia, polyphagia and polyuria.  Genitourinary: Negative.   Musculoskeletal: Negative.  Negative for arthralgias, back pain, gait  problem, joint swelling, myalgias, neck pain and neck stiffness.  Skin: Negative.   Allergic/Immunologic: Negative.   Neurological: Negative.  Negative for weakness.  Hematological: Negative.  Negative for adenopathy. Does not bruise/bleed easily.  Psychiatric/Behavioral: Negative.        Objective:   Physical Exam  Vitals reviewed. Constitutional: He is oriented to person, place, and time. He appears well-developed and well-nourished. No distress.  HENT:  Head: Normocephalic and atraumatic.  Mouth/Throat: Oropharynx is clear and moist. No oropharyngeal exudate.  Eyes: Conjunctivae are normal. Right eye exhibits no discharge. Left eye exhibits no discharge. No scleral icterus.  Neck: Normal range of motion. Neck supple. No JVD present. No tracheal deviation present. No thyromegaly present.  Cardiovascular: Normal rate, regular rhythm, normal heart sounds and intact distal pulses.  Exam reveals no gallop and no friction rub.   No murmur heard. Pulmonary/Chest: Effort normal and breath sounds normal. No stridor. No respiratory distress. He has no wheezes. He has no rales. He exhibits no tenderness.  Abdominal: Soft. Bowel sounds are normal. He exhibits no distension and no mass. There is no tenderness. There is no rebound and no guarding.  Musculoskeletal: Normal range of motion. He exhibits no edema and no tenderness.  Lymphadenopathy:    He has no cervical adenopathy.  Neurological: He is oriented to person, place, and time.  Skin: Skin is warm and dry. No rash noted. He is not diaphoretic. No erythema. No pallor.     Lab Results  Component Value Date   WBC 6.6 06/21/2012   HGB 15.0 06/21/2012   HCT 43.5 06/21/2012   PLT 276.0 06/21/2012   GLUCOSE 113* 10/09/2012   CHOL 143 06/21/2012   TRIG 197.0* 06/21/2012   HDL 34.60* 06/21/2012   LDLCALC 69 06/21/2012   ALT 26 10/09/2012  AST 22 10/09/2012   NA 142 10/09/2012   K 4.0 10/09/2012   CL 105 10/09/2012   CREATININE 1.2 10/09/2012    BUN 20 10/09/2012   CO2 29 10/09/2012   TSH 1.92 06/21/2012   PSA 0.28 06/21/2012   HGBA1C 7.8* 10/09/2012   MICROALBUR 0.2 06/21/2012       Assessment & Plan:

## 2013-06-18 NOTE — Patient Instructions (Signed)
Type 2 Diabetes Mellitus, Adult Type 2 diabetes mellitus, often simply referred to as type 2 diabetes, is a long-lasting (chronic) disease. In type 2 diabetes, the pancreas does not make enough insulin (a hormone), the cells are less responsive to the insulin that is made (insulin resistance), or both. Normally, insulin moves sugars from food into the tissue cells. The tissue cells use the sugars for energy. The lack of insulin or the lack of normal response to insulin causes excess sugars to build up in the blood instead of going into the tissue cells. As a result, high blood sugar (hyperglycemia) develops. The effect of high sugar (glucose) levels can cause many complications. Type 2 diabetes was also previously called adult-onset diabetes but it can occur at any age.  RISK FACTORS  A person is predisposed to developing type 2 diabetes if someone in the family has the disease and also has one or more of the following primary risk factors:  Overweight.  An inactive lifestyle.  A history of consistently eating high-calorie foods. Maintaining a normal weight and regular physical activity can reduce the chance of developing type 2 diabetes. SYMPTOMS  A person with type 2 diabetes may not show symptoms initially. The symptoms of type 2 diabetes appear slowly. The symptoms include:  Increased thirst (polydipsia).  Increased urination (polyuria).  Increased urination during the night (nocturia).  Weight loss. This weight loss may be rapid.  Frequent, recurring infections.  Tiredness (fatigue).  Weakness.  Vision changes, such as blurred vision.  Fruity smell to your breath.  Abdominal pain.  Nausea or vomiting.  Cuts or bruises which are slow to heal.  Tingling or numbness in the hands or feet. DIAGNOSIS Type 2 diabetes is frequently not diagnosed until complications of diabetes are present. Type 2 diabetes is diagnosed when symptoms or complications are present and when blood  glucose levels are increased. Your blood glucose level may be checked by one or more of the following blood tests:  A fasting blood glucose test. You will not be allowed to eat for at least 8 hours before a blood sample is taken.  A random blood glucose test. Your blood glucose is checked at any time of the day regardless of when you ate.  A hemoglobin A1c blood glucose test. A hemoglobin A1c test provides information about blood glucose control over the previous 3 months.  An oral glucose tolerance test (OGTT). Your blood glucose is measured after you have not eaten (fasted) for 2 hours and then after you drink a glucose-containing beverage. TREATMENT   You may need to take insulin or diabetes medicine daily to keep blood glucose levels in the desired range.  You will need to match insulin dosing with exercise and healthy food choices. The treatment goal is to maintain the before meal blood sugar (preprandial glucose) level at 70 130 mg/dL. HOME CARE INSTRUCTIONS   Have your hemoglobin A1c level checked twice a year.  Perform daily blood glucose monitoring as directed by your caregiver.  Monitor urine ketones when you are ill and as directed by your caregiver.  Take your diabetes medicine or insulin as directed by your caregiver to maintain your blood glucose levels in the desired range.  Never run out of diabetes medicine or insulin. It is needed every day.  Adjust insulin based on your intake of carbohydrates. Carbohydrates can raise blood glucose levels but need to be included in your diet. Carbohydrates provide vitamins, minerals, and fiber which are an essential part of   a healthy diet. Carbohydrates are found in fruits, vegetables, whole grains, dairy products, legumes, and foods containing added sugars.    Eat healthy foods. Alternate 3 meals with 3 snacks.  Lose weight if overweight.  Carry a medical alert card or wear your medical alert jewelry.  Carry a 15 gram  carbohydrate snack with you at all times to treat low blood glucose (hypoglycemia). Some examples of 15 gram carbohydrate snacks include:  Glucose tablets, 3 or 4   Glucose gel, 15 gram tube  Raisins, 2 tablespoons (24 grams)  Jelly beans, 6  Animal crackers, 8  Regular pop, 4 ounces (120 mL)  Gummy treats, 9  Recognize hypoglycemia. Hypoglycemia occurs with blood glucose levels of 70 mg/dL and below. The risk for hypoglycemia increases when fasting or skipping meals, during or after intense exercise, and during sleep. Hypoglycemia symptoms can include:  Tremors or shakes.  Decreased ability to concentrate.  Sweating.  Increased heart rate.  Headache.  Dry mouth.  Hunger.  Irritability.  Anxiety.  Restless sleep.  Altered speech or coordination.  Confusion.  Treat hypoglycemia promptly. If you are alert and able to safely swallow, follow the 15:15 rule:  Take 15 20 grams of rapid-acting glucose or carbohydrate. Rapid-acting options include glucose gel, glucose tablets, or 4 ounces (120 mL) of fruit juice, regular soda, or low fat milk.  Check your blood glucose level 15 minutes after taking the glucose.  Take 15 20 grams more of glucose if the repeat blood glucose level is still 70 mg/dL or below.  Eat a meal or snack within 1 hour once blood glucose levels return to normal.    Be alert to polyuria and polydipsia which are early signs of hyperglycemia. An early awareness of hyperglycemia allows for prompt treatment. Treat hyperglycemia as directed by your caregiver.  Engage in at least 150 minutes of moderate-intensity physical activity a week, spread over at least 3 days of the week or as directed by your caregiver. In addition, you should engage in resistance exercise at least 2 times a week or as directed by your caregiver.  Adjust your medicine and food intake as needed if you start a new exercise or sport.  Follow your sick day plan at any time you  are unable to eat or drink as usual.  Avoid tobacco use.  Limit alcohol intake to no more than 1 drink per day for nonpregnant women and 2 drinks per day for men. You should drink alcohol only when you are also eating food. Talk with your caregiver whether alcohol is safe for you. Tell your caregiver if you drink alcohol several times a week.  Follow up with your caregiver regularly.  Schedule an eye exam soon after the diagnosis of type 2 diabetes and then annually.  Perform daily skin and foot care. Examine your skin and feet daily for cuts, bruises, redness, nail problems, bleeding, blisters, or sores. A foot exam by a caregiver should be done annually.  Brush your teeth and gums at least twice a day and floss at least once a day. Follow up with your dentist regularly.  Share your diabetes management plan with your workplace or school.  Stay up-to-date with immunizations.  Learn to manage stress.  Obtain ongoing diabetes education and support as needed.  Participate in, or seek rehabilitation as needed to maintain or improve independence and quality of life. Request a physical or occupational therapy referral if you are having foot or hand numbness or difficulties with grooming,   dressing, eating, or physical activity. SEEK MEDICAL CARE IF:   You are unable to eat food or drink fluids for more than 6 hours.  You have nausea and vomiting for more than 6 hours.  Your blood glucose level is over 240 mg/dL.  There is a change in mental status.  You develop an additional serious illness.  You have diarrhea for more than 6 hours.  You have been sick or have had a fever for a couple of days and are not getting better.  You have pain during any physical activity.  SEEK IMMEDIATE MEDICAL CARE IF:  You have difficulty breathing.  You have moderate to large ketone levels. MAKE SURE YOU:  Understand these instructions.  Will watch your condition.  Will get help right away if  you are not doing well or get worse. Document Released: 05/16/2005 Document Revised: 02/08/2012 Document Reviewed: 12/13/2011 [employer] Patient Information 2014 [employer].  

## 2013-07-15 ENCOUNTER — Other Ambulatory Visit: Payer: Self-pay | Admitting: Internal Medicine

## 2013-07-16 ENCOUNTER — Other Ambulatory Visit: Payer: Self-pay | Admitting: Internal Medicine

## 2013-08-14 ENCOUNTER — Ambulatory Visit (INDEPENDENT_AMBULATORY_CARE_PROVIDER_SITE_OTHER): Payer: PRIVATE HEALTH INSURANCE | Admitting: Family Medicine

## 2013-08-14 VITALS — BP 110/60 | HR 86 | Temp 98.0°F | Resp 16 | Ht 69.0 in | Wt 208.2 lb

## 2013-08-14 DIAGNOSIS — E119 Type 2 diabetes mellitus without complications: Secondary | ICD-10-CM

## 2013-08-14 DIAGNOSIS — B349 Viral infection, unspecified: Secondary | ICD-10-CM

## 2013-08-14 DIAGNOSIS — E860 Dehydration: Secondary | ICD-10-CM

## 2013-08-14 DIAGNOSIS — R5383 Other fatigue: Secondary | ICD-10-CM

## 2013-08-14 DIAGNOSIS — R509 Fever, unspecified: Secondary | ICD-10-CM

## 2013-08-14 DIAGNOSIS — R5381 Other malaise: Secondary | ICD-10-CM

## 2013-08-14 DIAGNOSIS — B9789 Other viral agents as the cause of diseases classified elsewhere: Secondary | ICD-10-CM

## 2013-08-14 LAB — POCT CBC
Granulocyte percent: 66.1 % (ref 37–80)
HCT, POC: 46.5 % (ref 43.5–53.7)
Hemoglobin: 15.3 g/dL (ref 14.1–18.1)
Lymph, poc: 2.2 (ref 0.6–3.4)
MCH, POC: 30.9 pg (ref 27–31.2)
MCHC: 32.9 g/dL (ref 31.8–35.4)
MCV: 94 fL (ref 80–97)
MID (cbc): 0.7 (ref 0–0.9)
MPV: 8.4 fL (ref 0–99.8)
POC Granulocyte: 5.8 (ref 2–6.9)
POC LYMPH PERCENT: 25.7 %L (ref 10–50)
POC MID %: 8.2 % (ref 0–12)
Platelet Count, POC: 336 10*3/uL (ref 142–424)
RBC: 4.95 M/uL (ref 4.69–6.13)
RDW, POC: 13.4 %
WBC: 8.7 10*3/uL (ref 4.6–10.2)

## 2013-08-14 LAB — POCT INFLUENZA A/B
Influenza A, POC: NEGATIVE
Influenza B, POC: NEGATIVE

## 2013-08-14 LAB — POCT URINALYSIS DIPSTICK
Blood, UA: NEGATIVE
Glucose, UA: >=1000
Ketones, UA: 40
Leukocytes, UA: NEGATIVE
Nitrite, UA: NEGATIVE
Protein, UA: NEGATIVE
Spec Grav, UA: 1.02
Urobilinogen, UA: 0.2
pH, UA: 5

## 2013-08-14 LAB — COMPREHENSIVE METABOLIC PANEL
Alkaline Phosphatase: 55 U/L (ref 39–117)
BUN: 41 mg/dL — ABNORMAL HIGH (ref 6–23)
CO2: 25 mEq/L (ref 19–32)
Calcium: 9.5 mg/dL (ref 8.4–10.5)
Creat: 1.2 mg/dL (ref 0.50–1.35)
Glucose, Bld: 142 mg/dL — ABNORMAL HIGH (ref 70–99)
Sodium: 135 mEq/L (ref 135–145)
Total Bilirubin: 0.6 mg/dL (ref 0.2–1.2)
Total Protein: 7.8 g/dL (ref 6.0–8.3)

## 2013-08-14 LAB — COMPREHENSIVE METABOLIC PANEL WITH GFR
ALT: 25 U/L (ref 0–53)
AST: 25 U/L (ref 0–37)
Albumin: 4.3 g/dL (ref 3.5–5.2)
Chloride: 96 meq/L (ref 96–112)
Potassium: 4.3 meq/L (ref 3.5–5.3)

## 2013-08-14 LAB — POCT UA - MICROSCOPIC ONLY
Bacteria, U Microscopic: NEGATIVE
Casts, Ur, LPF, POC: NEGATIVE
Crystals, Ur, HPF, POC: NEGATIVE
Yeast, UA: NEGATIVE

## 2013-08-14 LAB — TSH: TSH: 1.324 u[IU]/mL (ref 0.350–4.500)

## 2013-08-14 LAB — GLUCOSE, POCT (MANUAL RESULT ENTRY): POC Glucose: 141 mg/dL — AB (ref 70–99)

## 2013-08-14 NOTE — Patient Instructions (Signed)
Dehydration, Adult Dehydration is when you lose more fluids from the body than you take in. Vital organs like the kidneys, brain, and heart cannot function without a proper amount of fluids and salt. Any loss of fluids from the body can cause dehydration.  CAUSES   Vomiting.  Diarrhea.  Excessive sweating.  Excessive urine output.  Fever. SYMPTOMS  Mild dehydration  Thirst.  Dry lips.  Slightly dry mouth. Moderate dehydration  Very dry mouth.  Sunken eyes.  Skin does not bounce back quickly when lightly pinched and released.  Dark urine and decreased urine production.  Decreased tear production.  Headache. Severe dehydration  Very dry mouth.  Extreme thirst.  Rapid, weak pulse (more than 100 beats per minute at rest).  Cold hands and feet.  Not able to sweat in spite of heat and temperature.  Rapid breathing.  Blue lips.  Confusion and lethargy.  Difficulty being awakened.  Minimal urine production.  No tears. DIAGNOSIS  Your caregiver will diagnose dehydration based on your symptoms and your exam. Blood and urine tests will help confirm the diagnosis. The diagnostic evaluation should also identify the cause of dehydration. TREATMENT  Treatment of mild or moderate dehydration can often be done at home by increasing the amount of fluids that you drink. It is best to drink small amounts of fluid more often. Drinking too much at one time can make vomiting worse. Refer to the home care instructions below. Severe dehydration needs to be treated at the hospital where you will probably be given intravenous (IV) fluids that contain water and electrolytes. HOME CARE INSTRUCTIONS   Ask your caregiver about specific rehydration instructions.  Drink enough fluids to keep your urine clear or pale yellow.  Drink small amounts frequently if you have nausea and vomiting.  Eat as you normally do.  Avoid:  Foods or drinks high in sugar.  Carbonated  drinks.  Juice.  Extremely hot or cold fluids.  Drinks with caffeine.  Fatty, greasy foods.  Alcohol.  Tobacco.  Overeating.  Gelatin desserts.  Wash your hands well to avoid spreading bacteria and viruses.  Only take over-the-counter or prescription medicines for pain, discomfort, or fever as directed by your caregiver.  Ask your caregiver if you should continue all prescribed and over-the-counter medicines.  Keep all follow-up appointments with your caregiver. SEEK MEDICAL CARE IF:  You have abdominal pain and it increases or stays in one area (localizes).  You have a rash, stiff neck, or severe headache.  You are irritable, sleepy, or difficult to awaken.  You are weak, dizzy, or extremely thirsty. SEEK IMMEDIATE MEDICAL CARE IF:   You are unable to keep fluids down or you get worse despite treatment.  You have frequent episodes of vomiting or diarrhea.  You have blood or green matter (bile) in your vomit.  You have blood in your stool or your stool looks black and tarry.  You have not urinated in 6 to 8 hours, or you have only urinated a small amount of very dark urine.  You have a fever.  You faint. MAKE SURE YOU:   Understand these instructions.  Will watch your condition.  Will get help right away if you are not doing well or get worse. Document Released: 05/16/2005 Document Revised: 08/08/2011 Document Reviewed: 01/03/2011 Exit[employer] Patient Information 2014 [employer].  

## 2013-08-14 NOTE — Progress Notes (Signed)
Chief Complaint:  Chief Complaint  Patient presents with  . Fatigue    per patient feeling feverish x 4 days    HPI: Edward Macdonald is a 63 y.o. male who is here for  Fever, fatigue, and loss of appetite. Pt had a subjective  fever Friday night into Saturday. He took aspirin Saturday which helped with the fever. The fatigue started on Friday. Loss of appetite started Saturday evening. No nausea, vomiting, or diarrhea. No sore throat, headache, or congestion. No hx of anxiety or depression. 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 He gets this feeling every year around the same time, but he feels like the symptoms are worse this time. He can normally take a day off and feel better. He has Diabetes, not well controlled.  He has not been nauseated. Never had headache. He has not had msk aches.+  dry cough. Deneis ear or facial pain. 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 No sick contacts. Last A1c in January 2015 was 8  Past Medical History  Diagnosis Date  . Diabetes mellitus     II  . Hypertension   . Hyperlipidemia   . Macular retinal edema    Past Surgical History  Procedure Laterality Date  . Cholecystectomy  1998   History   Social History  . Marital Status: Married    Spouse Name: N/A    Number of Children: N/A  . Years of Education: N/A   Occupational History  . accountant    Social History Main Topics  . Smoking status: Never Smoker   . Smokeless tobacco: Never Used  . Alcohol Use: 1.2 oz/week    2 Cans of beer per week     Comment: 2 cans beer weekly  . Drug Use: No  . Sexual Activity: Yes   Other Topics Concern  . None   Social History Narrative  . None   Family History  Problem Relation Age of Onset  . Heart disease  Mother   . Diabetes Mother   . Hypertension Mother   . Diabetes Brother     insulin depend.  . Hypertension Brother     possible d/t weight   No Known Allergies Prior to Admission medications   Medication Sig Start Date End Date Taking? Authorizing Provider  aspirin 81 MG tablet Take 81 mg by mouth daily.   Yes Historical Provider, MD  bisoprolol (ZEBETA) 5 MG tablet TAKE 1 TABLET (5 MG TOTAL) BY MOUTH DAILY. 10/12/12  Yes Janith Lima, MD  Canagliflozin (INVOKANA) 300 MG TABS Take 1 tablet (300 mg total) by mouth daily. 06/18/13  Yes Janith Lima, MD  FREESTYLE LITE test strip USE THREE TIMES A DAY   Yes Janith Lima, MD  glucose blood (FREESTYLE LITE) test strip Use as instructed to check blood sugar three times a day dx 250.52 07/20/12  Yes Janith Lima, MD  Insulin Pen Needle (B-D ULTRAFINE III SHORT PEN) 31G X 8 MM MISC Inject 1 Act into the skin 3 (three) times daily. 06/21/12  Yes Janith Lima, MD  metFORMIN (GLUCOPHAGE) 500 MG tablet TAKE 1 TABLET (500 MG TOTAL) BY MOUTH 2 (TWO) TIMES DAILY  WITH A MEAL. 05/15/13  Yes Janith Lima, MD  NOVOLOG MIX 70/30 FLEXPEN (70-30) 100 UNIT/ML Pen INJECT 60 UNITS INTO THE SKIN 2 (TWO) TIMES DAILY WITH A MEAL.   Yes Janith Lima, MD  rosuvastatin (CRESTOR) 10 MG tablet Take 1 tablet (10 mg total) by mouth daily. 10/09/12  Yes Janith Lima, MD  valsartan-hydrochlorothiazide (DIOVAN-HCT) 160-12.5 MG per tablet TAKE 1 TABLET BY MOUTH DAILY.   Yes Janith Lima, MD     ROS: The patient denies  chills, night sweats, unintentional weight loss, chest pain, palpitations, wheezing, dyspnea on exertion, nausea, vomiting, abdominal pain, dysuria, hematuria, melena, numbness,  or tingling.   All other systems have been reviewed and were otherwise negative with the exception of those mentioned in the HPI and as above.    PHYSICAL EXAM: Filed Vitals:   08/14/13 1504  BP: 110/60  Pulse: 86  Temp: 98 F (36.7 C)  Resp: 16   Filed Vitals:    08/14/13 1504  Height: 5\' 9"  (1.753 m)  Weight: 208 lb 3.2 oz (94.439 kg)   Body mass index is 30.73 kg/(m^2).  General: Alert, no acute distress HEENT:  Normocephalic, atraumatic, oropharynx patent. EOMI, PERRLA, tm nl. No exudates. No facial tenderness Cardiovascular:  Regular rate and rhythm, no rubs murmurs or gallops.  No Carotid bruits, radial pulse intact. No pedal edema.  Respiratory: Clear to auscultation bilaterally.  No wheezes, rales, or rhonchi.  No cyanosis, no use of accessory musculature GI: No organomegaly, abdomen is soft and non-tender, positive bowel sounds.  No masses. Skin: No rashes. Neurologic: Facial musculature symmetric. Per patient his speech is normal, no slurring, it is typically slowed and slightly stuttered. CN 2-12 grossly nl Psychiatric: Patient is appropriate throughout our interaction. Lymphatic: No cervical lymphadenopathy Musculoskeletal: Gait intact.   LABS: Results for orders placed in visit on 08/14/13  POCT CBC      Result Value Ref Range   WBC 8.7  4.6 - 10.2 K/uL   Lymph, poc 2.2  0.6 - 3.4   POC LYMPH PERCENT 25.7  10 - 50 %L   MID (cbc) 0.7  0 - 0.9   POC MID % 8.2  0 - 12 %M   POC Granulocyte 5.8  2 - 6.9   Granulocyte percent 66.1  37 - 80 %G   RBC 4.95  4.69 - 6.13 M/uL   Hemoglobin 15.3  14.1 - 18.1 g/dL   HCT, POC 46.5  43.5 - 53.7 %   MCV 94.0  80 - 97 fL   MCH, POC 30.9  27 - 31.2 pg   MCHC 32.9  31.8 - 35.4 g/dL   RDW, POC 13.4     Platelet Count, POC 336  142 - 424 K/uL   MPV 8.4  0 - 99.8 fL  POCT UA - MICROSCOPIC ONLY      Result Value Ref Range   WBC, Ur, HPF, POC 1-7     RBC, urine, microscopic 0-2     Bacteria, U Microscopic neg     Mucus, UA small     Epithelial cells, urine per micros 0-1     Crystals, Ur, HPF, POC neg     Casts, Ur, LPF, POC neg     Yeast, UA neg    POCT URINALYSIS DIPSTICK      Result Value Ref Range   Color, UA yellow     Clarity, UA clear     Glucose, UA >=1000  Bilirubin, UA  small     Ketones, UA 40     Spec Grav, UA 1.020     Blood, UA neg     pH, UA 5.0     Protein, UA neg     Urobilinogen, UA 0.2     Nitrite, UA neg     Leukocytes, UA Negative    POCT INFLUENZA A/B      Result Value Ref Range   Influenza A, POC Negative     Influenza B, POC Negative    GLUCOSE, POCT (MANUAL RESULT ENTRY)      Result Value Ref Range   POC Glucose 141 (*) 70 - 99 mg/dl     EKG/XRAY:   Primary read interpreted by Dr. Marin Comment at [facility].   ASSESSMENT/PLAN: Encounter Diagnoses  Name Primary?  . Other malaise and fatigue   . Diabetes   . Fever, unspecified   . Dehydration   . 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 Patient is aware that all medications have potential sideeffects and we are unable to predict every sideeffect or drug-drug interaction that may occur.  LE, [place], DO 08/14/2013 5:52 PM

## 2013-08-15 ENCOUNTER — Other Ambulatory Visit: Payer: Self-pay | Admitting: Internal Medicine

## 2013-09-13 ENCOUNTER — Other Ambulatory Visit: Payer: Self-pay | Admitting: Internal Medicine

## 2013-10-12 ENCOUNTER — Other Ambulatory Visit: Payer: Self-pay | Admitting: Internal Medicine

## 2013-12-12 ENCOUNTER — Other Ambulatory Visit: Payer: Self-pay | Admitting: Internal Medicine

## 2014-01-01 ENCOUNTER — Encounter: Payer: Self-pay | Admitting: Internal Medicine

## 2014-01-01 ENCOUNTER — Other Ambulatory Visit (INDEPENDENT_AMBULATORY_CARE_PROVIDER_SITE_OTHER): Payer: PRIVATE HEALTH INSURANCE

## 2014-01-01 ENCOUNTER — Ambulatory Visit (INDEPENDENT_AMBULATORY_CARE_PROVIDER_SITE_OTHER): Payer: PRIVATE HEALTH INSURANCE | Admitting: Internal Medicine

## 2014-01-01 VITALS — BP 120/70 | HR 75 | Temp 98.0°F | Resp 16 | Ht 69.0 in | Wt 216.0 lb

## 2014-01-01 DIAGNOSIS — E1165 Type 2 diabetes mellitus with hyperglycemia: Principal | ICD-10-CM

## 2014-01-01 DIAGNOSIS — E7800 Pure hypercholesterolemia, unspecified: Secondary | ICD-10-CM

## 2014-01-01 DIAGNOSIS — E1139 Type 2 diabetes mellitus with other diabetic ophthalmic complication: Secondary | ICD-10-CM

## 2014-01-01 DIAGNOSIS — I10 Essential (primary) hypertension: Secondary | ICD-10-CM

## 2014-01-01 DIAGNOSIS — Z2911 Encounter for prophylactic immunotherapy for respiratory syncytial virus (RSV): Secondary | ICD-10-CM

## 2014-01-01 LAB — COMPREHENSIVE METABOLIC PANEL
ALBUMIN: 4 g/dL (ref 3.5–5.2)
ALT: 19 U/L (ref 0–53)
AST: 19 U/L (ref 0–37)
Alkaline Phosphatase: 61 U/L (ref 39–117)
BUN: 22 mg/dL (ref 6–23)
CALCIUM: 9.5 mg/dL (ref 8.4–10.5)
CHLORIDE: 102 meq/L (ref 96–112)
CO2: 29 mEq/L (ref 19–32)
Creatinine, Ser: 1.3 mg/dL (ref 0.4–1.5)
GFR: 61.46 mL/min (ref >60.00)
Glucose, Bld: 162 mg/dL — ABNORMAL HIGH (ref 70–99)
POTASSIUM: 4.3 meq/L (ref 3.5–5.1)
SODIUM: 138 meq/L (ref 135–145)
TOTAL PROTEIN: 7.3 g/dL (ref 6.0–8.3)
Total Bilirubin: 0.4 mg/dL (ref 0.2–1.2)

## 2014-01-01 LAB — LIPID PANEL
Cholesterol: 86 mg/dL (ref 0–200)
HDL: 28.4 mg/dL — ABNORMAL LOW (ref >39.00)
NonHDL: 57.6
Total CHOL/HDL Ratio: 3
Triglycerides: 211 mg/dL — ABNORMAL HIGH (ref 0.0–149.0)
VLDL: 42.2 mg/dL — ABNORMAL HIGH (ref 0.0–40.0)

## 2014-01-01 LAB — LDL CHOLESTEROL, DIRECT: Direct LDL: 35 mg/dL

## 2014-01-01 LAB — HEMOGLOBIN A1C: HEMOGLOBIN A1C: 7.3 % — AB (ref 4.6–6.5)

## 2014-01-01 NOTE — Assessment & Plan Note (Signed)
His BP is well controlled His lytes and renal function are stable 

## 2014-01-01 NOTE — Patient Instructions (Signed)

## 2014-01-01 NOTE — Assessment & Plan Note (Signed)
His blood sugars have improved He will cont to work on his lifestyle modifications

## 2014-01-01 NOTE — Progress Notes (Signed)
Pre visit review using our clinic review tool, if applicable. No additional management support is needed unless otherwise documented below in the visit note. 

## 2014-01-01 NOTE — Progress Notes (Signed)
Subjective:    Patient ID: Edward Macdonald, male    DOB: [birth year], 63 y.o.   MRN: [id number]  Diabetes He presents for his follow-up diabetic visit. He has type 2 diabetes mellitus. His disease course has been stable. There are no hypoglycemic associated symptoms. Pertinent negatives for hypoglycemia include no dizziness, headaches, seizures, speech difficulty or tremors. Pertinent negatives for diabetes include no blurred vision, no chest pain, no fatigue, no foot paresthesias, no foot ulcerations, no polydipsia, no polyphagia, no polyuria, no visual change, no weakness and no weight loss. There are no hypoglycemic complications. Symptoms are improving. There are no diabetic complications. Current diabetic treatment includes insulin injections and oral agent (monotherapy). He is compliant with treatment all of the time. His weight is stable. He is following a generally healthy diet. Meal planning includes avoidance of concentrated sweets. He participates in exercise intermittently. There is no change in his home blood glucose trend. An ACE inhibitor/angiotensin II receptor blocker is being taken. He does not see a podiatrist.Eye exam is current.      Review of Systems  Constitutional: Negative.  Negative for fever, chills, weight loss, diaphoresis, appetite change and fatigue.  HENT: Negative.   Eyes: Negative.  Negative for blurred vision.  Respiratory: Negative.  Negative for cough, choking, chest tightness, shortness of breath, wheezing and stridor.   Cardiovascular: Negative.  Negative for chest pain, palpitations and leg swelling.  Gastrointestinal: Negative.  Negative for nausea, vomiting, abdominal pain, diarrhea, constipation and blood in stool.  Endocrine: Negative.  Negative for polydipsia, polyphagia and polyuria.  Genitourinary: Negative.   Musculoskeletal: Negative.  Negative for arthralgias, back pain, gait problem, joint swelling, myalgias, neck pain and neck stiffness.  Skin:  Negative.  Negative for rash.  Allergic/Immunologic: Negative.   Neurological: Negative.  Negative for dizziness, tremors, seizures, syncope, facial asymmetry, speech difficulty, weakness, light-headedness, numbness and headaches.  Hematological: Negative.  Negative for adenopathy. Does not bruise/bleed easily.  Psychiatric/Behavioral: Negative.        Objective:   Physical Exam  Vitals reviewed. Constitutional: He is oriented to person, place, and time. He appears well-developed and well-nourished. No distress.  HENT:  Head: Normocephalic and atraumatic.  Mouth/Throat: Oropharynx is clear and moist. No oropharyngeal exudate.  Eyes: Conjunctivae are normal. Right eye exhibits no discharge. Left eye exhibits no discharge. No scleral icterus.  Neck: Normal range of motion. Neck supple. No JVD present. No tracheal deviation present. No thyromegaly present.  Cardiovascular: Normal rate, regular rhythm, normal heart sounds and intact distal pulses.  Exam reveals no gallop and no friction rub.   No murmur heard. Pulmonary/Chest: Effort normal and breath sounds normal. No stridor. No respiratory distress. He has no wheezes. He has no rales. He exhibits no tenderness.  Abdominal: Soft. Bowel sounds are normal. He exhibits no distension and no mass. There is no tenderness. There is no rebound and no guarding.  Musculoskeletal: Normal range of motion. He exhibits no edema and no tenderness.  Lymphadenopathy:    He has no cervical adenopathy.  Neurological: He is oriented to person, place, and time.  Skin: Skin is warm and dry. No rash noted. He is not diaphoretic. No erythema. No pallor.  Psychiatric: He has a normal mood and affect. His behavior is normal. Judgment and thought content normal.     Lab Results  Component Value Date   WBC 8.7 08/14/2013   HGB 15.3 08/14/2013   HCT 46.5 08/14/2013   PLT 244.0 06/18/2013   GLUCOSE 142*  08/14/2013   CHOL 90 06/18/2013   TRIG 158.0* 06/18/2013   HDL  35.20* 06/18/2013   LDLCALC 23 06/18/2013   ALT 25 08/14/2013   AST 25 08/14/2013   NA 135 08/14/2013   K 4.3 08/14/2013   CL 96 08/14/2013   CREATININE 1.20 08/14/2013   BUN 41* 08/14/2013   CO2 25 08/14/2013   TSH 1.324 08/14/2013   PSA 0.28 06/21/2012   HGBA1C 8.0* 06/18/2013   MICROALBUR 0.2 06/21/2012       Assessment & Plan:

## 2014-01-01 NOTE — Assessment & Plan Note (Signed)
He has achieved his LDL goal 

## 2014-02-12 ENCOUNTER — Other Ambulatory Visit: Payer: Self-pay | Admitting: Internal Medicine

## 2014-04-14 ENCOUNTER — Other Ambulatory Visit: Payer: Self-pay | Admitting: Internal Medicine

## 2014-05-30 HISTORY — PX: CATARACT EXTRACTION: SUR2

## 2014-06-10 ENCOUNTER — Other Ambulatory Visit: Payer: Self-pay

## 2014-06-10 ENCOUNTER — Other Ambulatory Visit: Payer: Self-pay | Admitting: Internal Medicine

## 2014-06-10 ENCOUNTER — Encounter: Payer: Self-pay | Admitting: Internal Medicine

## 2014-06-10 DIAGNOSIS — E118 Type 2 diabetes mellitus with unspecified complications: Secondary | ICD-10-CM

## 2014-06-10 MED ORDER — DAPAGLIFLOZIN PROPANEDIOL 10 MG PO TABS: 10.0000 mg | ORAL_TABLET | Freq: Every day | ORAL | Status: DC

## 2014-07-21 ENCOUNTER — Other Ambulatory Visit: Payer: Self-pay

## 2014-07-21 MED ORDER — ROSUVASTATIN CALCIUM 10 MG PO TABS: 10.0000 mg | ORAL_TABLET | Freq: Every day | ORAL | Status: DC

## 2014-07-23 ENCOUNTER — Other Ambulatory Visit: Payer: Self-pay | Admitting: *Deleted

## 2014-07-23 MED ORDER — GLUCOSE BLOOD VI STRP: ORAL_STRIP | Status: DC

## 2014-07-25 ENCOUNTER — Telehealth: Payer: Self-pay

## 2014-07-25 MED ORDER — ONETOUCH ULTRASOFT LANCETS MISC: Status: DC

## 2014-07-25 MED ORDER — ONETOUCH ULTRA MINI W/DEVICE KIT: PACK | Status: DC

## 2014-07-25 MED ORDER — GLUCOSE BLOOD VI STRP: ORAL_STRIP | Status: DC

## 2014-07-25 NOTE — Telephone Encounter (Signed)
Received pharmacy rejection stating that insurance will not cover freestyle lite diabetic strip without a prior authorization. Monitor and supplies changes to One touch ultra that is covered per [facility].

## 2014-08-18 ENCOUNTER — Telehealth: Payer: Self-pay | Admitting: Internal Medicine

## 2014-08-18 ENCOUNTER — Telehealth: Payer: Self-pay

## 2014-08-18 MED ORDER — METFORMIN HCL 500 MG PO TABS: ORAL_TABLET | ORAL | Status: DC

## 2014-08-18 MED ORDER — ROSUVASTATIN CALCIUM 10 MG PO TABS: 10.0000 mg | ORAL_TABLET | Freq: Every day | ORAL | Status: DC

## 2014-08-18 MED ORDER — ROSUVASTATIN CALCIUM 10 MG PO TABS
10.0000 mg | ORAL_TABLET | Freq: Every day | ORAL | Status: DC
Start: 2014-08-18 — End: 2014-08-18

## 2014-08-18 NOTE — Telephone Encounter (Signed)
Faxed refill request received for crestor and metformin.

## 2014-08-18 NOTE — Telephone Encounter (Signed)
done

## 2014-08-18 NOTE — Telephone Encounter (Signed)
States last two scripts sent today went to incorrect pharmacy.  Needs to go to [facility].  He is requesting this pharmacy to be his primary pharmacy on file.

## 2014-10-09 LAB — HM DIABETES EYE EXAM

## 2014-10-17 ENCOUNTER — Other Ambulatory Visit: Payer: Self-pay

## 2014-10-17 MED ORDER — INSULIN PEN NEEDLE 31G X 8 MM MISC: Status: DC

## 2014-10-17 MED ORDER — ROSUVASTATIN CALCIUM 10 MG PO TABS: 10.0000 mg | ORAL_TABLET | Freq: Every day | ORAL | Status: DC

## 2014-10-17 MED ORDER — METFORMIN HCL 500 MG PO TABS: ORAL_TABLET | ORAL | Status: DC

## 2014-10-17 MED ORDER — BISOPROLOL FUMARATE 5 MG PO TABS
ORAL_TABLET | ORAL | Status: DC
Start: 2014-10-17 — End: 2014-11-13

## 2014-10-23 ENCOUNTER — Encounter: Payer: Self-pay | Admitting: Internal Medicine

## 2014-10-23 ENCOUNTER — Other Ambulatory Visit: Payer: Self-pay | Admitting: Internal Medicine

## 2014-10-23 ENCOUNTER — Ambulatory Visit (INDEPENDENT_AMBULATORY_CARE_PROVIDER_SITE_OTHER): Payer: PRIVATE HEALTH INSURANCE | Admitting: Internal Medicine

## 2014-10-23 ENCOUNTER — Other Ambulatory Visit (INDEPENDENT_AMBULATORY_CARE_PROVIDER_SITE_OTHER): Payer: PRIVATE HEALTH INSURANCE

## 2014-10-23 VITALS — BP 118/70 | HR 60 | Temp 98.0°F | Resp 16 | Ht 69.0 in | Wt 214.0 lb

## 2014-10-23 DIAGNOSIS — I10 Essential (primary) hypertension: Secondary | ICD-10-CM

## 2014-10-23 DIAGNOSIS — R200 Anesthesia of skin: Secondary | ICD-10-CM

## 2014-10-23 DIAGNOSIS — R208 Other disturbances of skin sensation: Secondary | ICD-10-CM

## 2014-10-23 DIAGNOSIS — E538 Deficiency of other specified B group vitamins: Secondary | ICD-10-CM | POA: Insufficient documentation

## 2014-10-23 DIAGNOSIS — E118 Type 2 diabetes mellitus with unspecified complications: Secondary | ICD-10-CM

## 2014-10-23 DIAGNOSIS — Z23 Encounter for immunization: Secondary | ICD-10-CM

## 2014-10-23 DIAGNOSIS — E785 Hyperlipidemia, unspecified: Secondary | ICD-10-CM

## 2014-10-23 DIAGNOSIS — G63 Polyneuropathy in diseases classified elsewhere: Principal | ICD-10-CM

## 2014-10-23 LAB — COMPREHENSIVE METABOLIC PANEL
ALT: 20 U/L (ref 0–53)
AST: 20 U/L (ref 0–37)
Albumin: 4.4 g/dL (ref 3.5–5.2)
Alkaline Phosphatase: 53 U/L (ref 39–117)
BUN: 18 mg/dL (ref 6–23)
CO2: 32 mEq/L (ref 19–32)
Calcium: 10.1 mg/dL (ref 8.4–10.5)
Chloride: 102 mEq/L (ref 96–112)
Creatinine, Ser: 1.07 mg/dL (ref 0.40–1.50)
GFR: 74.02 mL/min (ref >60.00)
Glucose, Bld: 71 mg/dL (ref 70–99)
Potassium: 4 mEq/L (ref 3.5–5.1)
SODIUM: 140 meq/L (ref 135–145)
TOTAL PROTEIN: 7.5 g/dL (ref 6.0–8.3)
Total Bilirubin: 0.4 mg/dL (ref 0.2–1.2)

## 2014-10-23 LAB — CBC WITH DIFFERENTIAL/PLATELET
BASOS ABS: 0.1 10*3/uL (ref 0.0–0.1)
BASOS PCT: 0.7 % (ref 0.0–3.0)
Eosinophils Absolute: 0.2 10*3/uL (ref 0.0–0.7)
Eosinophils Relative: 2.8 % (ref 0.0–5.0)
HEMATOCRIT: 47.4 % (ref 39.0–52.0)
HEMOGLOBIN: 15.9 g/dL (ref 13.0–17.0)
LYMPHS ABS: 2.9 10*3/uL (ref 0.7–4.0)
Lymphocytes Relative: 39.8 % (ref 12.0–46.0)
MCHC: 33.6 g/dL (ref 30.0–36.0)
MCV: 92.1 fl (ref 78.0–100.0)
MONO ABS: 0.6 10*3/uL (ref 0.1–1.0)
Monocytes Relative: 8 % (ref 3.0–12.0)
Neutro Abs: 3.5 10*3/uL (ref 1.4–7.7)
Neutrophils Relative %: 48.7 % (ref 43.0–77.0)
Platelets: 258 10*3/uL (ref 150.0–400.0)
RBC: 5.15 Mil/uL (ref 4.22–5.81)
RDW: 14.3 % (ref 11.5–15.5)
WBC: 7.2 10*3/uL (ref 4.0–10.5)

## 2014-10-23 LAB — LIPID PANEL
CHOL/HDL RATIO: 3
CHOLESTEROL: 96 mg/dL (ref 0–200)
HDL: 30.7 mg/dL — AB (ref >39.00)
LDL Cholesterol: 38 mg/dL (ref 0–99)
NonHDL: 65.3
Triglycerides: 136 mg/dL (ref 0.0–149.0)
VLDL: 27.2 mg/dL (ref 0.0–40.0)

## 2014-10-23 LAB — URINALYSIS, ROUTINE W REFLEX MICROSCOPIC
BILIRUBIN URINE: NEGATIVE
Hgb urine dipstick: NEGATIVE
Ketones, ur: NEGATIVE
Leukocytes, UA: NEGATIVE
Nitrite: NEGATIVE
PH: 5.5 (ref 5.0–8.0)
Specific Gravity, Urine: 1.025 (ref 1.000–1.030)
Total Protein, Urine: NEGATIVE
UROBILINOGEN UA: 0.2 (ref 0.0–1.0)
Urine Glucose: >=1000 — AB

## 2014-10-23 LAB — MICROALBUMIN / CREATININE URINE RATIO
CREATININE, U: 114.1 mg/dL
Microalb Creat Ratio: 0.6 mg/g (ref 0.0–30.0)
Microalb, Ur: <0.7 mg/dL (ref 0.0–1.9)

## 2014-10-23 LAB — TSH: TSH: 1.83 u[IU]/mL (ref 0.35–4.50)

## 2014-10-23 LAB — FOLATE: FOLATE: 10.7 ng/mL (ref >5.9)

## 2014-10-23 LAB — VITAMIN B12: VITAMIN B 12: 196 pg/mL — AB (ref 211–911)

## 2014-10-23 LAB — HEMOGLOBIN A1C: Hgb A1c MFr Bld: 6.6 % — ABNORMAL HIGH (ref 4.6–6.5)

## 2014-10-23 MED ORDER — CYANOCOBALAMIN 500 MCG/0.1ML NA SOLN: 0.1000 mL | NASAL | Status: DC

## 2014-10-23 MED ORDER — ASPIRIN 81 MG PO TABS: 81.0000 mg | ORAL_TABLET | Freq: Every day | ORAL | Status: AC

## 2014-10-23 NOTE — Progress Notes (Signed)
Subjective:  Patient ID: Edward Macdonald, male    DOB: June 28, 1950  Age: 64 y.o. MRN: [id number]  CC: Hyperlipidemia; Diabetes; and Hypertension   HPI [name] presents for follow up regarding DM 2 with retinopathy, he reports that his blood sugars have been well controlled, never going above 140 and usually in the 90-130 range. He complains of numbness in his feet today, this has been worsening over the last few years and is worse on the right than the left.  Outpatient Prescriptions Prior to Visit  Medication Sig Dispense Refill  . aspirin 81 MG tablet Take 81 mg by mouth daily.    . bisoprolol (ZEBETA) 5 MG tablet TAKE 1 TABLET (5 MG TOTAL) BY MOUTH DAILY. 90 tablet 0  . dapagliflozin propanediol (FARXIGA) 10 MG TABS tablet Take 10 mg by mouth daily. 90 tablet 3  . glucose blood (ONE TOUCH ULTRA TEST) test strip USE THREE TIMES A DAY Dx: E11.8 100 each 12  . Insulin Pen Needle (B-D ULTRAFINE III SHORT PEN) 31G X 8 MM MISC INJECT INTO THE SKIN 3 (THREE) TIMES DAILY. 100 each 0  . Lancets (ONETOUCH ULTRASOFT) lancets USE THREE TIMES A DAY Dx: E11.8 100 each 12  . metFORMIN (GLUCOPHAGE) 500 MG tablet TAKE 1 TABLET (500 MG TOTAL) BY MOUTH 2 (TWO) TIMES DAILY WITH A MEAL. 60 tablet 0  . NOVOLOG MIX 70/30 FLEXPEN (70-30) 100 UNIT/ML Pen INJECT 60 UNITS INTO THE SKIN 2 (TWO) TIMES DAILY WITH A MEAL. 45 mL 11  . rosuvastatin (CRESTOR) 10 MG tablet Take 1 tablet (10 mg total) by mouth daily. 30 tablet 0  . valsartan-hydrochlorothiazide (DIOVAN-HCT) 160-12.5 MG per tablet TAKE 1 TABLET BY MOUTH DAILY. 90 tablet 1  . Blood Glucose Monitoring Suppl (ONE TOUCH ULTRA MINI) W/DEVICE KIT USE THREE TIMES A DAY Dx: E11.8 1 each 1   Facility-Administered Medications Prior to Visit  Medication Dose Route Frequency Provider Last Rate Last Dose  . 0.9 %  sodium chloride infusion  500 mL Intravenous Continuous Lafayette Dragon, MD        ROS Review of Systems  Constitutional: Negative.  Negative for  fever, chills, diaphoresis, appetite change and fatigue.  HENT: Negative.  Negative for trouble swallowing and voice change.   Eyes: Negative.   Respiratory: Negative.  Negative for apnea, cough, choking, chest tightness, shortness of breath, wheezing and stridor.   Cardiovascular: Negative.  Negative for chest pain, palpitations and leg swelling.  Gastrointestinal: Negative.  Negative for nausea, vomiting, abdominal pain, diarrhea, constipation and blood in stool.  Endocrine: Negative.  Negative for polydipsia, polyphagia and polyuria.  Genitourinary: Negative.   Musculoskeletal: Negative.  Negative for myalgias, back pain, joint swelling, arthralgias, gait problem, neck pain and neck stiffness.  Skin: Negative.  Negative for rash.  Allergic/Immunologic: Negative.   Neurological: Positive for numbness. Negative for dizziness, tremors, seizures, syncope, facial asymmetry, speech difficulty, weakness, light-headedness and headaches.  Hematological: Negative for adenopathy. Does not bruise/bleed easily.  Psychiatric/Behavioral: Negative.     Objective:  BP 118/70 mmHg  Pulse 60  Temp(Src) 98 F (36.7 C) (Oral)  Resp 16  Ht $R'5\' 9"'hd$  (1.753 m)  Wt 214 lb (97.07 kg)  BMI 31.59 kg/m2  SpO2 97%  BP Readings from Last 3 Encounters:  10/23/14 118/70  01/01/14 120/70  08/14/13 110/60    Wt Readings from Last 3 Encounters:  10/23/14 214 lb (97.07 kg)  01/01/14 216 lb (97.977 kg)  08/14/13 208 lb 3.2 oz (  94.439 kg)    Physical Exam  Constitutional: He appears well-developed and well-nourished.  Non-toxic appearance. He does not have a sickly appearance. He does not appear ill. No distress.  HENT:  Head: Normocephalic and atraumatic.  Mouth/Throat: Oropharynx is clear and moist. No oropharyngeal exudate.  Eyes: Conjunctivae are normal. Right eye exhibits no discharge. Left eye exhibits no discharge. No scleral icterus.  Neck: Normal range of motion. Neck supple. No JVD present. No  tracheal deviation present. No thyromegaly present.  Cardiovascular: Normal rate, regular rhythm, normal heart sounds and intact distal pulses.  Exam reveals no gallop and no friction rub.   No murmur heard. Pulmonary/Chest: Effort normal and breath sounds normal. No stridor. No respiratory distress. He has no wheezes. He has no rales. He exhibits no tenderness.  Abdominal: Soft. Bowel sounds are normal. He exhibits no distension and no mass. There is no tenderness. There is no rebound and no guarding.  Musculoskeletal: Normal range of motion. He exhibits no edema or tenderness.  Lymphadenopathy:    He has no cervical adenopathy.  Neurological: He is alert. He has normal strength. He displays no atrophy, no tremor and normal reflexes. No cranial nerve deficit or sensory deficit. He exhibits normal muscle tone. He displays a negative Romberg sign. He displays no seizure activity. Coordination and gait normal.  Reflex Scores:      Tricep reflexes are 1+ on the right side and 1+ on the left side.      Bicep reflexes are 1+ on the right side and 1+ on the left side.      Brachioradialis reflexes are 1+ on the right side and 1+ on the left side.      Patellar reflexes are 0 on the right side and 0 on the left side.      Achilles reflexes are 0 on the right side and 0 on the left side. Skin: Skin is warm and dry. No rash noted. He is not diaphoretic. No erythema. No pallor.  Psychiatric: He has a normal mood and affect. His behavior is normal. Judgment normal.  Vitals reviewed.   Lab Results  Component Value Date   WBC 8.7 08/14/2013   HGB 15.3 08/14/2013   HCT 46.5 08/14/2013   PLT 244.0 06/18/2013   GLUCOSE 162* 01/01/2014   CHOL 86 01/01/2014   TRIG 211.0* 01/01/2014   HDL 28.40* 01/01/2014   LDLDIRECT 35.0 01/01/2014   LDLCALC 23 06/18/2013   ALT 19 01/01/2014   AST 19 01/01/2014   NA 138 01/01/2014   K 4.3 01/01/2014   CL 102 01/01/2014   CREATININE 1.3 01/01/2014   BUN 22  01/01/2014   CO2 29 01/01/2014   TSH 1.324 08/14/2013   PSA 0.28 06/21/2012   HGBA1C 7.3* 01/01/2014   MICROALBUR 0.2 06/21/2012    No results found.  Assessment & Plan:   Kaseem was seen today for hyperlipidemia, diabetes and hypertension.  Diagnoses and all orders for this visit:  Essential hypertension, benign - his BP is well controlled, will monitor his lytes an renal function Orders: -     Comprehensive metabolic panel; Future -     CBC with Differential/Platelet; Future -     Urinalysis, Routine w reflex microscopic (not at [facility]); Future  Type II diabetes mellitus with manifestations - will recheck his A1C and will address if indicated Orders: -     Hemoglobin A1c; Future -     Microalbumin / creatinine urine ratio; Future  Hyperlipidemia with target  LDL less than 70 - he is doing well on crestor Orders: -     Lipid panel; Future -     TSH; Future  Numbness in feet - this appears to be diabetic neuropathy, will check his labs to screen for metabolic causes and F20/TKTCCE deficiency Orders: -     CBC with Differential/Platelet; Future -     Vitamin B12; Future -     Folate; Future   I have discontinued Mr. Dambach ONE TOUCH ULTRA MINI. I am also having him maintain his aspirin, NOVOLOG MIX 70/30 FLEXPEN, valsartan-hydrochlorothiazide, dapagliflozin propanediol, glucose blood, onetouch ultrasoft, metFORMIN, Insulin Pen Needle, rosuvastatin, and bisoprolol. We will continue to administer sodium chloride.  No orders of the defined types were placed in this encounter.     Follow-up: Return in about 4 months (around 02/23/2015).  Scarlette Calico, MD

## 2014-10-23 NOTE — Patient Instructions (Signed)

## 2014-10-24 DIAGNOSIS — Z23 Encounter for immunization: Secondary | ICD-10-CM | POA: Diagnosis not present

## 2014-10-24 NOTE — Addendum Note (Signed)
Addended by: Estell Harpin T on: 10/24/2014 09:18 AM   Modules accepted: Orders

## 2014-10-30 ENCOUNTER — Telehealth: Payer: Self-pay | Admitting: *Deleted

## 2014-10-30 ENCOUNTER — Ambulatory Visit (INDEPENDENT_AMBULATORY_CARE_PROVIDER_SITE_OTHER): Payer: PRIVATE HEALTH INSURANCE | Admitting: *Deleted

## 2014-10-30 DIAGNOSIS — E538 Deficiency of other specified B group vitamins: Secondary | ICD-10-CM

## 2014-10-30 MED ORDER — CYANOCOBALAMIN 1000 MCG/ML IJ SOLN
1000.0000 ug | Freq: Once | INTRAMUSCULAR | Status: AC
  Administered 2014-10-30: 1000 ug via INTRAMUSCULAR

## 2014-10-30 NOTE — Telephone Encounter (Signed)
He should come in for B12 injections Every 2 weeks for 6 weeks then monthly

## 2014-10-30 NOTE — Telephone Encounter (Signed)
Pt states the nasal B12 will cost over $ 150. He is wanting to know does he need to take the b12, or is there anything else he can take that is not too expensive...Edward Macdonald

## 2014-10-31 ENCOUNTER — Encounter: Payer: Self-pay | Admitting: *Deleted

## 2014-10-31 NOTE — Telephone Encounter (Signed)
Tried calling pt # on file no longer available. Sent pt mychart msg with md response...Edward Macdonald

## 2014-11-12 ENCOUNTER — Ambulatory Visit (INDEPENDENT_AMBULATORY_CARE_PROVIDER_SITE_OTHER): Payer: PRIVATE HEALTH INSURANCE | Admitting: *Deleted

## 2014-11-12 ENCOUNTER — Ambulatory Visit: Payer: PRIVATE HEALTH INSURANCE | Admitting: Internal Medicine

## 2014-11-12 DIAGNOSIS — E538 Deficiency of other specified B group vitamins: Secondary | ICD-10-CM | POA: Diagnosis not present

## 2014-11-12 MED ORDER — CYANOCOBALAMIN 1000 MCG/ML IJ SOLN
1000.0000 ug | Freq: Once | INTRAMUSCULAR | Status: AC
  Administered 2014-11-12: 1000 ug via INTRAMUSCULAR

## 2014-11-13 ENCOUNTER — Other Ambulatory Visit: Payer: Self-pay | Admitting: Internal Medicine

## 2014-11-27 ENCOUNTER — Ambulatory Visit (INDEPENDENT_AMBULATORY_CARE_PROVIDER_SITE_OTHER): Payer: PRIVATE HEALTH INSURANCE

## 2014-11-27 DIAGNOSIS — D509 Iron deficiency anemia, unspecified: Secondary | ICD-10-CM

## 2014-11-27 MED ORDER — CYANOCOBALAMIN 1000 MCG/ML IJ SOLN: 1000.0000 ug | Freq: Once | INTRAMUSCULAR | Status: DC

## 2014-11-27 MED ORDER — CYANOCOBALAMIN 1000 MCG/ML IJ SOLN
1000.0000 ug | Freq: Once | INTRAMUSCULAR | Status: AC
  Administered 2014-11-27: 1000 ug via INTRAMUSCULAR

## 2014-11-27 NOTE — Addendum Note (Signed)
Addended by: Ander Slade on: 11/27/2014 10:00 AM   Modules accepted: Orders, Medications

## 2014-12-26 ENCOUNTER — Ambulatory Visit (INDEPENDENT_AMBULATORY_CARE_PROVIDER_SITE_OTHER): Payer: PRIVATE HEALTH INSURANCE

## 2014-12-26 DIAGNOSIS — G63 Polyneuropathy in diseases classified elsewhere: Principal | ICD-10-CM

## 2014-12-26 DIAGNOSIS — E538 Deficiency of other specified B group vitamins: Secondary | ICD-10-CM

## 2014-12-26 MED ORDER — CYANOCOBALAMIN 1000 MCG/ML IJ SOLN
1000.0000 ug | Freq: Once | INTRAMUSCULAR | Status: AC
  Administered 2014-12-26: 1000 ug via SUBCUTANEOUS

## 2015-01-13 ENCOUNTER — Other Ambulatory Visit: Payer: Self-pay | Admitting: Internal Medicine

## 2015-01-23 ENCOUNTER — Ambulatory Visit (INDEPENDENT_AMBULATORY_CARE_PROVIDER_SITE_OTHER): Payer: PRIVATE HEALTH INSURANCE

## 2015-01-23 DIAGNOSIS — E538 Deficiency of other specified B group vitamins: Secondary | ICD-10-CM

## 2015-01-23 DIAGNOSIS — G63 Polyneuropathy in diseases classified elsewhere: Principal | ICD-10-CM

## 2015-01-23 MED ORDER — CYANOCOBALAMIN 1000 MCG/ML IJ SOLN
1000.0000 ug | Freq: Once | INTRAMUSCULAR | Status: AC
  Administered 2015-01-23: 1000 ug via INTRAMUSCULAR

## 2015-02-16 ENCOUNTER — Other Ambulatory Visit: Payer: Self-pay | Admitting: Internal Medicine

## 2015-02-26 ENCOUNTER — Ambulatory Visit (INDEPENDENT_AMBULATORY_CARE_PROVIDER_SITE_OTHER): Payer: PRIVATE HEALTH INSURANCE

## 2015-02-26 DIAGNOSIS — E538 Deficiency of other specified B group vitamins: Secondary | ICD-10-CM

## 2015-02-26 MED ORDER — CYANOCOBALAMIN 1000 MCG/ML IJ SOLN
1000.0000 ug | Freq: Once | INTRAMUSCULAR | Status: AC
  Administered 2015-02-26: 1000 ug via INTRAMUSCULAR

## 2015-03-16 ENCOUNTER — Other Ambulatory Visit: Payer: Self-pay | Admitting: Internal Medicine

## 2015-03-25 ENCOUNTER — Encounter: Payer: Self-pay | Admitting: Internal Medicine

## 2015-03-25 ENCOUNTER — Other Ambulatory Visit (INDEPENDENT_AMBULATORY_CARE_PROVIDER_SITE_OTHER): Payer: PRIVATE HEALTH INSURANCE

## 2015-03-25 ENCOUNTER — Ambulatory Visit (INDEPENDENT_AMBULATORY_CARE_PROVIDER_SITE_OTHER): Payer: PRIVATE HEALTH INSURANCE | Admitting: Internal Medicine

## 2015-03-25 VITALS — BP 122/68 | HR 61 | Temp 97.8°F | Resp 16 | Ht 69.0 in | Wt 218.0 lb

## 2015-03-25 DIAGNOSIS — E118 Type 2 diabetes mellitus with unspecified complications: Secondary | ICD-10-CM

## 2015-03-25 DIAGNOSIS — E538 Deficiency of other specified B group vitamins: Secondary | ICD-10-CM | POA: Diagnosis not present

## 2015-03-25 DIAGNOSIS — E1142 Type 2 diabetes mellitus with diabetic polyneuropathy: Secondary | ICD-10-CM

## 2015-03-25 DIAGNOSIS — I10 Essential (primary) hypertension: Secondary | ICD-10-CM | POA: Diagnosis not present

## 2015-03-25 DIAGNOSIS — Z23 Encounter for immunization: Secondary | ICD-10-CM | POA: Diagnosis not present

## 2015-03-25 DIAGNOSIS — Z794 Long term (current) use of insulin: Secondary | ICD-10-CM

## 2015-03-25 DIAGNOSIS — E1140 Type 2 diabetes mellitus with diabetic neuropathy, unspecified: Secondary | ICD-10-CM | POA: Insufficient documentation

## 2015-03-25 DIAGNOSIS — G63 Polyneuropathy in diseases classified elsewhere: Secondary | ICD-10-CM

## 2015-03-25 LAB — BASIC METABOLIC PANEL
BUN: 17 mg/dL (ref 6–23)
CHLORIDE: 102 meq/L (ref 96–112)
CO2: 30 meq/L (ref 19–32)
CREATININE: 1.05 mg/dL (ref 0.40–1.50)
Calcium: 10 mg/dL (ref 8.4–10.5)
GFR: 75.55 mL/min (ref >60.00)
Glucose, Bld: 88 mg/dL (ref 70–99)
Potassium: 4.2 mEq/L (ref 3.5–5.1)
Sodium: 142 mEq/L (ref 135–145)

## 2015-03-25 LAB — HEMOGLOBIN A1C: Hgb A1c MFr Bld: 7.6 % — ABNORMAL HIGH (ref 4.6–6.5)

## 2015-03-25 MED ORDER — FA-PYRIDOXINE-CYANOCOBALAMIN 2.5-25-2 MG PO TABS: 1.0000 | ORAL_TABLET | Freq: Every day | ORAL | Status: DC

## 2015-03-25 MED ORDER — CYANOCOBALAMIN 1000 MCG/ML IJ SOLN
1000.0000 ug | Freq: Once | INTRAMUSCULAR | Status: AC
  Administered 2015-03-25: 1000 ug via INTRAMUSCULAR

## 2015-03-25 NOTE — Progress Notes (Signed)
Subjective:  Patient ID: Edward Macdonald, male    DOB: 01-Jan-1951  Age: 64 y.o. MRN: [id number]  CC: Diabetes and Hypertension   HPI Edward Macdonald presents for hypertension and diabetes. He has not been very successful with his lifestyle modifications and he has gained about 4 pounds. He also complains of worsening numbness in both feet over the last few years. This is more prominent on the right than the left. There is no pain associated with this.  Outpatient Prescriptions Prior to Visit  Medication Sig Dispense Refill  . aspirin 81 MG tablet Take 1 tablet (81 mg total) by mouth daily. 30 tablet 11  . B-D ULTRAFINE III SHORT PEN 31G X 8 MM MISC INJECT INTO THE SKIN THREE TIMES A DAY 100 each 11  . bisoprolol (ZEBETA) 5 MG tablet TAKE ONE TABLET BY MOUTH EVERY DAY 30 tablet 5  . Cyanocobalamin 500 MCG/0.1ML SOLN Place 0.1 mLs (500 mcg total) into the nose once a week. 2.3 mL 11  . dapagliflozin propanediol (FARXIGA) 10 MG TABS tablet Take 10 mg by mouth daily. 90 tablet 3  . glucose blood (ONE TOUCH ULTRA TEST) test strip USE THREE TIMES A DAY Dx: E11.8 100 each 12  . Lancets (ONETOUCH ULTRASOFT) lancets USE THREE TIMES A DAY Dx: E11.8 100 each 12  . metFORMIN (GLUCOPHAGE) 500 MG tablet TAKE ONE TABLET BY MOUTH TWICE A DAY WITH A MEAL 60 tablet 5  . NOVOLOG MIX 70/30 FLEXPEN (70-30) 100 UNIT/ML FlexPen INJECT 60 UNITS INTO THE SKIN SUBCUTANEOUSLY TWICE DAILY WITH A MEAL 45 mL 10  . rosuvastatin (CRESTOR) 10 MG tablet TAKE ONE TABLET BY MOUTH EVERY DAY 30 tablet 5  . valsartan-hydrochlorothiazide (DIOVAN-HCT) 160-12.5 MG per tablet TAKE ONE TABLET BY MOUTH EVERY DAY 90 tablet 0   No facility-administered medications prior to visit.    ROS Review of Systems  Constitutional: Positive for unexpected weight change. Negative for fever, chills, diaphoresis, appetite change and fatigue.  HENT: Negative.   Eyes: Negative.   Respiratory: Negative.  Negative for cough, choking, chest tightness,  shortness of breath, wheezing and stridor.   Cardiovascular: Negative.  Negative for chest pain, palpitations and leg swelling.  Gastrointestinal: Negative.  Negative for nausea, vomiting, abdominal pain, diarrhea and constipation.  Endocrine: Negative.  Negative for polydipsia, polyphagia and polyuria.  Genitourinary: Negative.   Musculoskeletal: Negative.  Negative for myalgias, back pain, joint swelling and arthralgias.  Skin: Negative.  Negative for rash.  Allergic/Immunologic: Negative.   Neurological: Positive for numbness. Negative for dizziness, tremors, seizures, syncope, facial asymmetry, speech difficulty, weakness, light-headedness and headaches.  Hematological: Negative.  Negative for adenopathy. Does not bruise/bleed easily.  Psychiatric/Behavioral: Negative.     Objective:  BP 122/68 mmHg  Pulse 61  Temp(Src) 97.8 F (36.6 C) (Oral)  Resp 16  Ht 5\' 9"  (1.753 m)  Wt 218 lb (98.884 kg)  BMI 32.18 kg/m2  SpO2 97%  BP Readings from Last 3 Encounters:  03/25/15 122/68  10/23/14 118/70  01/01/14 120/70    Wt Readings from Last 3 Encounters:  03/25/15 218 lb (98.884 kg)  10/23/14 214 lb (97.07 kg)  01/01/14 216 lb (97.977 kg)    Physical Exam  Constitutional: He is oriented to person, place, and time. He appears well-developed and well-nourished. No distress.  HENT:  Head: Normocephalic and atraumatic.  Mouth/Throat: Oropharynx is clear and moist. No oropharyngeal exudate.  Eyes: Conjunctivae are normal. Right eye exhibits no discharge. Left eye exhibits no discharge.  No scleral icterus.  Neck: Normal range of motion. Neck supple. No JVD present. No tracheal deviation present. No thyromegaly present.  Cardiovascular: Normal rate, regular rhythm, normal heart sounds and intact distal pulses.  Exam reveals no gallop and no friction rub.   No murmur heard. Pulmonary/Chest: Effort normal and breath sounds normal. No stridor. No respiratory distress. He has no wheezes.  He has no rales. He exhibits no tenderness.  Abdominal: Soft. Bowel sounds are normal. He exhibits no distension and no mass. There is no tenderness. There is no rebound and no guarding.  Musculoskeletal: Normal range of motion. He exhibits no edema or tenderness.  Lymphadenopathy:    He has no cervical adenopathy.  Neurological: He is alert and oriented to person, place, and time. He has normal strength. He displays no atrophy, no tremor and normal reflexes. No cranial nerve deficit or sensory deficit. He exhibits normal muscle tone. He displays a negative Romberg sign. He displays no seizure activity. Coordination and gait normal.  Reflex Scores:      Tricep reflexes are 1+ on the right side and 1+ on the left side.      Bicep reflexes are 1+ on the right side and 1+ on the left side.      Brachioradialis reflexes are 1+ on the right side and 1+ on the left side.      Patellar reflexes are 0 on the right side and 0 on the left side.      Achilles reflexes are 0 on the right side and 0 on the left side. There is diminished vibratory sensation in both Achilles  Skin: Skin is warm and dry. No rash noted. He is not diaphoretic. No erythema. No pallor.  Vitals reviewed.   Lab Results  Component Value Date   WBC 7.2 10/23/2014   HGB 15.9 10/23/2014   HCT 47.4 10/23/2014   PLT 258.0 10/23/2014   GLUCOSE 88 03/25/2015   CHOL 96 10/23/2014   TRIG 136.0 10/23/2014   HDL 30.70* 10/23/2014   LDLDIRECT 35.0 01/01/2014   LDLCALC 38 10/23/2014   ALT 20 10/23/2014   AST 20 10/23/2014   NA 142 03/25/2015   K 4.2 03/25/2015   CL 102 03/25/2015   CREATININE 1.05 03/25/2015   BUN 17 03/25/2015   CO2 30 03/25/2015   TSH 1.83 10/23/2014   PSA 0.28 06/21/2012   HGBA1C 7.6* 03/25/2015   MICROALBUR <0.7 10/23/2014    No results found.  Assessment & Plan:   Edward Macdonald was seen today for diabetes and hypertension.  Diagnoses and all orders for this visit:  Essential hypertension, benign- his  blood pressure is well-controlled, electrolytes and renal function are stable. -     Basic metabolic panel; Future  Type 2 diabetes mellitus with complication, with long-term current use of insulin ([place])- his A1c is up to 7.6%. He agrees to improve on his lifestyle modifications and at this time I will not add another medication. -     Basic metabolic panel; Future -     Hemoglobin A1c; Future  Vitamin B12 deficiency neuropathy- will cont B12 replacement therapy -     cyanocobalamin ((VITAMIN B-12)) injection 1,000 mcg; Inject 1 mL (1,000 mcg total) into the muscle once. -     folic acid-pyridoxine-cyancobalamin (FOLBIC) 2.5-25-2 MG TABS tablet; Take 1 tablet by mouth daily.  Diabetic polyneuropathy associated with type 2 diabetes mellitus ([place])- will start Folbic to reduce the progression of diabetic neuropathy. -     folic  acid-pyridoxine-cyancobalamin (FOLBIC) 2.5-25-2 MG TABS tablet; Take 1 tablet by mouth daily.  Need for influenza vaccination -     Flu Vaccine QUAD 36+ mos IM  I am having Mr. Russey start on folic acid-pyridoxine-cyancobalamin. I am also having him maintain his dapagliflozin propanediol, glucose blood, onetouch ultrasoft, aspirin, Cyanocobalamin, metFORMIN, rosuvastatin, bisoprolol, valsartan-hydrochlorothiazide, NOVOLOG MIX 70/30 FLEXPEN, B-D ULTRAFINE III SHORT PEN, BESIVANCE, DUREZOL, and ILEVRO. We administered cyanocobalamin.  Meds ordered this encounter  Medications  . BESIVANCE 0.6 % SUSP    Sig:   . DUREZOL 0.05 % EMUL    Sig:   . ILEVRO 0.3 % ophthalmic suspension    Sig:   . cyanocobalamin ((VITAMIN B-12)) injection 1,000 mcg    Sig:   . folic acid-pyridoxine-cyancobalamin (FOLBIC) 2.5-25-2 MG TABS tablet    Sig: Take 1 tablet by mouth daily.    Dispense:  90 tablet    Refill:  3     Follow-up: Return in about 6 months (around 09/23/2015).  Scarlette Calico, MD

## 2015-03-25 NOTE — Patient Instructions (Signed)

## 2015-03-25 NOTE — Progress Notes (Signed)
Pre visit review using our clinic review tool, if applicable. No additional management support is needed unless otherwise documented below in the visit note. 

## 2015-04-05 ENCOUNTER — Encounter: Payer: Self-pay | Admitting: Internal Medicine

## 2015-04-15 ENCOUNTER — Other Ambulatory Visit: Payer: Self-pay | Admitting: Internal Medicine

## 2015-04-30 ENCOUNTER — Ambulatory Visit (INDEPENDENT_AMBULATORY_CARE_PROVIDER_SITE_OTHER): Payer: PRIVATE HEALTH INSURANCE

## 2015-04-30 DIAGNOSIS — E538 Deficiency of other specified B group vitamins: Secondary | ICD-10-CM | POA: Diagnosis not present

## 2015-04-30 MED ORDER — CYANOCOBALAMIN 1000 MCG/ML IJ SOLN
1000.0000 ug | Freq: Once | INTRAMUSCULAR | Status: AC
  Administered 2015-04-30: 1000 ug via INTRAMUSCULAR

## 2015-05-16 ENCOUNTER — Other Ambulatory Visit: Payer: Self-pay | Admitting: Internal Medicine

## 2015-06-03 ENCOUNTER — Ambulatory Visit (INDEPENDENT_AMBULATORY_CARE_PROVIDER_SITE_OTHER): Payer: PRIVATE HEALTH INSURANCE

## 2015-06-03 DIAGNOSIS — E538 Deficiency of other specified B group vitamins: Secondary | ICD-10-CM | POA: Diagnosis not present

## 2015-06-03 MED ORDER — CYANOCOBALAMIN 1000 MCG/ML IJ SOLN
1000.0000 ug | Freq: Once | INTRAMUSCULAR | Status: AC
  Administered 2015-06-03: 1000 ug via INTRAMUSCULAR

## 2015-06-15 ENCOUNTER — Other Ambulatory Visit: Payer: Self-pay | Admitting: Internal Medicine

## 2015-07-08 ENCOUNTER — Ambulatory Visit (INDEPENDENT_AMBULATORY_CARE_PROVIDER_SITE_OTHER): Payer: PRIVATE HEALTH INSURANCE

## 2015-07-08 DIAGNOSIS — E538 Deficiency of other specified B group vitamins: Secondary | ICD-10-CM

## 2015-07-08 MED ORDER — CYANOCOBALAMIN 1000 MCG/ML IJ SOLN
1000.0000 ug | Freq: Once | INTRAMUSCULAR | Status: AC
  Administered 2015-07-08: 1000 ug via INTRAMUSCULAR

## 2015-07-16 ENCOUNTER — Other Ambulatory Visit: Payer: Self-pay | Admitting: Internal Medicine

## 2015-08-06 ENCOUNTER — Ambulatory Visit (INDEPENDENT_AMBULATORY_CARE_PROVIDER_SITE_OTHER): Payer: PRIVATE HEALTH INSURANCE

## 2015-08-06 DIAGNOSIS — E538 Deficiency of other specified B group vitamins: Secondary | ICD-10-CM

## 2015-08-06 MED ORDER — CYANOCOBALAMIN 1000 MCG/ML IJ SOLN
1000.0000 ug | Freq: Once | INTRAMUSCULAR | Status: AC
  Administered 2015-08-06: 1000 ug via INTRAMUSCULAR

## 2015-08-13 ENCOUNTER — Other Ambulatory Visit: Payer: Self-pay | Admitting: Internal Medicine

## 2015-09-10 ENCOUNTER — Ambulatory Visit (INDEPENDENT_AMBULATORY_CARE_PROVIDER_SITE_OTHER): Payer: PRIVATE HEALTH INSURANCE

## 2015-09-10 DIAGNOSIS — E538 Deficiency of other specified B group vitamins: Secondary | ICD-10-CM | POA: Diagnosis not present

## 2015-09-10 MED ORDER — CYANOCOBALAMIN 1000 MCG/ML IJ SOLN
1000.0000 ug | Freq: Once | INTRAMUSCULAR | Status: AC
  Administered 2015-09-10: 1000 ug via INTRAMUSCULAR

## 2015-09-17 LAB — HM DIABETES EYE EXAM

## 2015-10-13 ENCOUNTER — Other Ambulatory Visit: Payer: Self-pay | Admitting: Internal Medicine

## 2015-10-14 ENCOUNTER — Other Ambulatory Visit (INDEPENDENT_AMBULATORY_CARE_PROVIDER_SITE_OTHER): Payer: PRIVATE HEALTH INSURANCE

## 2015-10-14 ENCOUNTER — Encounter: Payer: Self-pay | Admitting: Internal Medicine

## 2015-10-14 ENCOUNTER — Ambulatory Visit (INDEPENDENT_AMBULATORY_CARE_PROVIDER_SITE_OTHER): Payer: PRIVATE HEALTH INSURANCE | Admitting: Internal Medicine

## 2015-10-14 VITALS — BP 124/82 | HR 65 | Temp 97.8°F | Resp 16 | Ht 69.0 in | Wt 219.0 lb

## 2015-10-14 DIAGNOSIS — E538 Deficiency of other specified B group vitamins: Secondary | ICD-10-CM

## 2015-10-14 DIAGNOSIS — Z1159 Encounter for screening for other viral diseases: Secondary | ICD-10-CM

## 2015-10-14 DIAGNOSIS — Z0000 Encounter for general adult medical examination without abnormal findings: Secondary | ICD-10-CM

## 2015-10-14 DIAGNOSIS — I10 Essential (primary) hypertension: Secondary | ICD-10-CM | POA: Diagnosis not present

## 2015-10-14 DIAGNOSIS — E118 Type 2 diabetes mellitus with unspecified complications: Secondary | ICD-10-CM

## 2015-10-14 DIAGNOSIS — R5383 Other fatigue: Secondary | ICD-10-CM | POA: Insufficient documentation

## 2015-10-14 DIAGNOSIS — R9431 Abnormal electrocardiogram [ECG] [EKG]: Secondary | ICD-10-CM | POA: Insufficient documentation

## 2015-10-14 DIAGNOSIS — E1142 Type 2 diabetes mellitus with diabetic polyneuropathy: Secondary | ICD-10-CM

## 2015-10-14 DIAGNOSIS — Z794 Long term (current) use of insulin: Secondary | ICD-10-CM | POA: Diagnosis not present

## 2015-10-14 DIAGNOSIS — G63 Polyneuropathy in diseases classified elsewhere: Secondary | ICD-10-CM

## 2015-10-14 LAB — URINALYSIS, ROUTINE W REFLEX MICROSCOPIC
Bilirubin Urine: NEGATIVE
Hgb urine dipstick: NEGATIVE
KETONES UR: NEGATIVE
LEUKOCYTES UA: NEGATIVE
NITRITE: NEGATIVE
Specific Gravity, Urine: 1.02 (ref 1.000–1.030)
Total Protein, Urine: NEGATIVE
UROBILINOGEN UA: 0.2 (ref 0.0–1.0)
pH: 5 (ref 5.0–8.0)

## 2015-10-14 LAB — CBC WITH DIFFERENTIAL/PLATELET
BASOS ABS: 0 10*3/uL (ref 0.0–0.1)
Basophils Relative: 0.5 % (ref 0.0–3.0)
Eosinophils Absolute: 0.2 10*3/uL (ref 0.0–0.7)
Eosinophils Relative: 3.4 % (ref 0.0–5.0)
HCT: 46.2 % (ref 39.0–52.0)
HEMOGLOBIN: 16 g/dL (ref 13.0–17.0)
LYMPHS ABS: 2.2 10*3/uL (ref 0.7–4.0)
LYMPHS PCT: 34.8 % (ref 12.0–46.0)
MCHC: 34.6 g/dL (ref 30.0–36.0)
MCV: 93.1 fl (ref 78.0–100.0)
MONOS PCT: 7.1 % (ref 3.0–12.0)
Monocytes Absolute: 0.5 10*3/uL (ref 0.1–1.0)
NEUTROS PCT: 54.2 % (ref 43.0–77.0)
Neutro Abs: 3.5 10*3/uL (ref 1.4–7.7)
Platelets: 230 10*3/uL (ref 150.0–400.0)
RBC: 4.96 Mil/uL (ref 4.22–5.81)
RDW: 14.8 % (ref 11.5–15.5)
WBC: 6.4 10*3/uL (ref 4.0–10.5)

## 2015-10-14 LAB — HEMOGLOBIN A1C: Hgb A1c MFr Bld: 7.5 % — ABNORMAL HIGH (ref 4.6–6.5)

## 2015-10-14 LAB — LIPID PANEL
CHOLESTEROL: 101 mg/dL (ref 0–200)
HDL: 27.6 mg/dL — ABNORMAL LOW (ref >39.00)
LDL Cholesterol: 38 mg/dL (ref 0–99)
NonHDL: 73.59
TRIGLYCERIDES: 176 mg/dL — AB (ref 0.0–149.0)
Total CHOL/HDL Ratio: 4
VLDL: 35.2 mg/dL (ref 0.0–40.0)

## 2015-10-14 LAB — COMPREHENSIVE METABOLIC PANEL
ALBUMIN: 4.6 g/dL (ref 3.5–5.2)
ALK PHOS: 44 U/L (ref 39–117)
ALT: 30 U/L (ref 0–53)
AST: 21 U/L (ref 0–37)
BILIRUBIN TOTAL: 0.5 mg/dL (ref 0.2–1.2)
BUN: 20 mg/dL (ref 6–23)
CALCIUM: 10 mg/dL (ref 8.4–10.5)
CO2: 31 mEq/L (ref 19–32)
Chloride: 103 mEq/L (ref 96–112)
Creatinine, Ser: 1.02 mg/dL (ref 0.40–1.50)
GFR: 77.98 mL/min (ref >60.00)
GLUCOSE: 88 mg/dL (ref 70–99)
Potassium: 4.2 mEq/L (ref 3.5–5.1)
Sodium: 141 mEq/L (ref 135–145)
TOTAL PROTEIN: 7.2 g/dL (ref 6.0–8.3)

## 2015-10-14 LAB — PSA: PSA: 0.26 ng/mL (ref 0.10–4.00)

## 2015-10-14 LAB — TSH: TSH: 2.45 u[IU]/mL (ref 0.35–4.50)

## 2015-10-14 LAB — MICROALBUMIN / CREATININE URINE RATIO
CREATININE, U: 98.4 mg/dL
Microalb Creat Ratio: 0.7 mg/g (ref 0.0–30.0)
Microalb, Ur: <0.7 mg/dL (ref 0.0–1.9)

## 2015-10-14 MED ORDER — METANX 3-90.314-2-35 MG PO CAPS: 1.0000 | ORAL_CAPSULE | Freq: Two times a day (BID) | ORAL | Status: DC

## 2015-10-14 MED ORDER — CYANOCOBALAMIN 1000 MCG/ML IJ SOLN
1000.0000 ug | Freq: Once | INTRAMUSCULAR | Status: AC
  Administered 2015-10-14: 1000 ug via INTRAMUSCULAR

## 2015-10-14 NOTE — Patient Instructions (Signed)

## 2015-10-14 NOTE — Progress Notes (Signed)
Subjective:  Patient ID: Edward Macdonald, male    DOB: [birth year]  Age: 65 y.o. MRN: [id number]  CC: Hyperlipidemia; Hypertension; Diabetes; and Annual Exam   HPI Edward Macdonald presents for a CPX-  He complains of worsening fatigue with exertion over the last year.  He doesn't think his blood sugars are well-controlled as he has fasting BS's that are about 140-165 and postprandial numbers that are little higher than that. He complains of urinary frequency stating that he urinates every 2-3 hours during the day. He does not report nocturia or urinary hesitancy. He also complains of worsening numbness in both feet worse on the right than the left. He has a history of diabetic neuropathy. He does not experience any pain in his legs or feet. He denies claudication.  Outpatient Prescriptions Prior to Visit  Medication Sig Dispense Refill  . aspirin 81 MG tablet Take 1 tablet (81 mg total) by mouth daily. 30 tablet 11  . B-D ULTRAFINE III SHORT PEN 31G X 8 MM MISC USE TO INJECT INTO THE SKIN THREE TIMES A DAY 100 each 7  . BESIVANCE 0.6 % SUSP     . bisoprolol (ZEBETA) 5 MG tablet TAKE ONE TABLET BY MOUTH EVERY DAY. 30 tablet 5  . Cyanocobalamin 500 MCG/0.1ML SOLN Place 0.1 mLs (500 mcg total) into the nose once a week. 2.3 mL 11  . DUREZOL 0.05 % EMUL     . FARXIGA 10 MG TABS tablet TAKE ONE TABLET BY MOUTH EVERY DAY 90 tablet 1  . glucose blood (FREESTYLE LITE) test strip Use to check blood sugars three times a day Dx e11.9 100 each 11  . ILEVRO 0.3 % ophthalmic suspension     . Lancets (ONETOUCH ULTRASOFT) lancets USE THREE TIMES A DAY Dx: E11.8 100 each 12  . metFORMIN (GLUCOPHAGE) 500 MG tablet TAKE ONE TABLET BY MOUTH TWICE A DAY WITH A MEAL. 60 tablet 5  . NOVOLOG MIX 70/30 FLEXPEN (70-30) 100 UNIT/ML FlexPen INJECT 60 UNITS INTO THE SKIN SUBCUTANEOUSLY TWICE DAILY WITH A MEAL 45 mL 10  . rosuvastatin (CRESTOR) 10 MG tablet TAKE ONE TABLET BY MOUTH EVERY DAY. 30 tablet 5  . folic  acid-pyridoxine-cyancobalamin (FOLBIC) 2.5-25-2 MG TABS tablet Take 1 tablet by mouth daily. 90 tablet 3  . valsartan-hydrochlorothiazide (DIOVAN-HCT) 160-12.5 MG tablet TAKE ONE TABLET BY MOUTH EVERY DAY. 90 tablet 1   No facility-administered medications prior to visit.    ROS Review of Systems  Constitutional: Positive for fatigue. Negative for diaphoresis, activity change and unexpected weight change.  HENT: Negative.   Eyes: Negative.  Negative for visual disturbance.  Respiratory: Negative.  Negative for cough, chest tightness, shortness of breath and wheezing.   Cardiovascular: Negative.  Negative for chest pain, palpitations and leg swelling.  Gastrointestinal: Negative.  Negative for nausea, vomiting, abdominal pain and diarrhea.  Endocrine: Positive for polydipsia. Negative for polyphagia and polyuria.  Genitourinary: Positive for frequency. Negative for dysuria, urgency, hematuria, flank pain, difficulty urinating and testicular pain.  Musculoskeletal: Negative.  Negative for myalgias and arthralgias.  Skin: Negative.   Allergic/Immunologic: Negative.   Neurological: Negative.  Negative for dizziness, tremors, facial asymmetry, weakness, light-headedness and numbness.  Hematological: Negative.  Negative for adenopathy. Does not bruise/bleed easily.  Psychiatric/Behavioral: Negative.     Objective:  BP 124/82 mmHg  Pulse 65  Temp(Src) 97.8 F (36.6 C) (Oral)  Resp 16  Ht 5\' 9"  (1.753 m)  Wt 219 lb (99.338 kg)  BMI 32.33  kg/m2  SpO2 95%  BP Readings from Last 3 Encounters:  10/14/15 124/82  03/25/15 122/68  10/23/14 118/70    Wt Readings from Last 3 Encounters:  10/14/15 219 lb (99.338 kg)  03/25/15 218 lb (98.884 kg)  10/23/14 214 lb (97.07 kg)    Physical Exam  Constitutional: He is oriented to person, place, and time. He appears well-developed and well-nourished. No distress.  HENT:  Mouth/Throat: Oropharynx is clear and moist. No oropharyngeal exudate.    Eyes: Conjunctivae are normal. Right eye exhibits no discharge. Left eye exhibits no discharge. No scleral icterus.  Neck: Normal range of motion. Neck supple. No JVD present. No tracheal deviation present. No thyromegaly present.  Cardiovascular: Normal rate, regular rhythm, normal heart sounds and intact distal pulses.  Exam reveals no gallop and no friction rub.   No murmur heard. EKG ----  Sinus  Rhythm  -Nonspecific QRS widening.   BORDERLINE   Pulmonary/Chest: Effort normal and breath sounds normal. No stridor. No respiratory distress. He has no wheezes. He has no rales. He exhibits no tenderness.  Abdominal: Soft. Bowel sounds are normal. He exhibits no distension and no mass. There is no tenderness. There is no rebound and no guarding. Hernia confirmed negative in the right inguinal area and confirmed negative in the left inguinal area.  Genitourinary: Rectum normal, prostate normal, testes normal and penis normal. Rectal exam shows no external hemorrhoid, no internal hemorrhoid, no fissure, no mass, no tenderness and anal tone normal. Guaiac negative stool. Prostate is not enlarged and not tender. Right testis shows no mass, no swelling and no tenderness. Right testis is descended. Left testis shows no mass, no swelling and no tenderness. Left testis is descended. Circumcised. No penile erythema or penile tenderness. No discharge found.  Musculoskeletal: Normal range of motion. He exhibits no edema or tenderness.  Lymphadenopathy:    He has no cervical adenopathy.       Right: No inguinal adenopathy present.       Left: No inguinal adenopathy present.  Neurological: He is oriented to person, place, and time.  Skin: Skin is warm and dry. No rash noted. He is not diaphoretic. No erythema. No pallor.  Psychiatric: He has a normal mood and affect. His behavior is normal. Judgment and thought content normal.  Vitals reviewed.   Lab Results  Component Value Date   WBC 6.4 10/14/2015    HGB 16.0 10/14/2015   HCT 46.2 10/14/2015   PLT 230.0 10/14/2015   GLUCOSE 88 10/14/2015   CHOL 101 10/14/2015   TRIG 176.0* 10/14/2015   HDL 27.60* 10/14/2015   LDLDIRECT 35.0 01/01/2014   LDLCALC 38 10/14/2015   ALT 30 10/14/2015   AST 21 10/14/2015   NA 141 10/14/2015   K 4.2 10/14/2015   CL 103 10/14/2015   CREATININE 1.02 10/14/2015   BUN 20 10/14/2015   CO2 31 10/14/2015   TSH 2.45 10/14/2015   PSA 0.26 10/14/2015   HGBA1C 7.5* 10/14/2015   MICROALBUR <0.7 10/14/2015    No results found.  Assessment & Plan:   Abshir was seen today for hyperlipidemia, hypertension, diabetes and annual exam.  Diagnoses and all orders for this visit:  Routine general medical examination at a health care facility- Exam completed, labs ordered and reviewed, vaccines reviewed and updated, his colonoscopy is up-to-date. Medication education material was given. -     Lipid panel; Future -     Comprehensive metabolic panel; Future -     CBC with  Differential/Platelet; Future -     Urinalysis, Routine w reflex microscopic (not at [facility]); Future -     TSH; Future -     PSA; Future -     Hepatitis C antibody; Future -     HIV antibody; Future  Type 2 diabetes mellitus with complication, with Macdonald-term current use of insulin ([place])- his A1c is 7.5% which is adequate blood sugar control. He has a low C-peptide to the main stay of therapy will continue to be insulin but I also think he would continue to benefit from taking an SGLT2 inhibitor to decrease high blood sugar excursions as well as metformin to increase insulin sensitivity. -     Hemoglobin A1c; Future -     Microalbumin / creatinine urine ratio; Future -     C-peptide; Future  Need for hepatitis C screening test -     Hepatitis C antibody; Future  Diabetic polyneuropathy associated with type 2 diabetes mellitus ([place])- we'll try MetanX to treat this -     L-Methylfolate-Algae-B12-B6 (METANX) 3-90.314-2-35 MG CAPS; Take 1 capsule by  mouth 2 (two) times daily.  Essential hypertension- his blood sugars adequately well-controlled, electrolytes and renal function are stable. -     EKG 12-Lead  Other fatigue- his EKG shows QRS widening but no evidence of Q waves or acute ischemia, he is high risk for coronary artery disease so I have ordered a Lexiscan to screen for ischemia. -     Myocardial Perfusion Imaging; Future  Nonspecific abnormal electrocardiogram (ECG) (EKG)- I will screen for cardiac ischemia with a Lexiscan. -     Myocardial Perfusion Imaging; Future  Vitamin B12 deficiency neuropathy -     cyanocobalamin ((VITAMIN B-12)) injection 1,000 mcg; Inject 1 mL (1,000 mcg total) into the muscle once.   I have discontinued Mr. Gurkin folic acid-pyridoxine-cyancobalamin. I am also having him start on METANX. Additionally, I am having him maintain his onetouch ultrasoft, aspirin, Cyanocobalamin, NOVOLOG MIX 70/30 FLEXPEN, BESIVANCE, DUREZOL, ILEVRO, metFORMIN, bisoprolol, rosuvastatin, B-D ULTRAFINE III SHORT PEN, FARXIGA, and glucose blood. We administered cyanocobalamin.  Meds ordered this encounter  Medications  . L-Methylfolate-Algae-B12-B6 (METANX) 3-90.314-2-35 MG CAPS    Sig: Take 1 capsule by mouth 2 (two) times daily.    Dispense:  180 capsule    Refill:  3  . cyanocobalamin ((VITAMIN B-12)) injection 1,000 mcg    Sig:      Follow-up: Return in about 3 months (around 01/14/2016).  Scarlette Calico, MD

## 2015-10-14 NOTE — Progress Notes (Signed)
Pre visit review using our clinic review tool, if applicable. No additional management support is needed unless otherwise documented below in the visit note. 

## 2015-10-15 ENCOUNTER — Encounter: Payer: Self-pay | Admitting: Internal Medicine

## 2015-10-15 LAB — HEPATITIS C ANTIBODY: HCV Ab: NEGATIVE

## 2015-10-15 LAB — HIV ANTIBODY (ROUTINE TESTING W REFLEX): HIV 1&2 Ab, 4th Generation: NONREACTIVE

## 2015-10-15 LAB — C-PEPTIDE: C-Peptide: 0.78 ng/mL — ABNORMAL LOW (ref 0.80–3.85)

## 2015-10-19 ENCOUNTER — Telehealth: Payer: Self-pay | Admitting: *Deleted

## 2015-10-19 NOTE — Telephone Encounter (Signed)
Lft message for patient to call and scheduled myoview ordered by Dr. Scarlette Calico

## 2015-10-22 ENCOUNTER — Telehealth (HOSPITAL_COMMUNITY): Payer: Self-pay | Admitting: *Deleted

## 2015-10-22 NOTE — Telephone Encounter (Signed)
Left message on voicemail per DPR in reference to upcoming appointment scheduled on 10/28/15 with detailed instructions given per Myocardial Perfusion Study Information Sheet for the test. LM to arrive 15 minutes early, and that it is imperative to arrive on time for appointment to keep from having the test rescheduled. If you need to cancel or reschedule your appointment, please call the office within 24 hours of your appointment. Failure to do so may result in a cancellation of your appointment, and a $50 no show fee. Phone number given for call back for any questions. Hubbard Robinson, RN

## 2015-10-28 ENCOUNTER — Ambulatory Visit (HOSPITAL_COMMUNITY): Payer: PRIVATE HEALTH INSURANCE | Attending: Cardiovascular Disease

## 2015-10-28 ENCOUNTER — Encounter: Payer: Self-pay | Admitting: Internal Medicine

## 2015-10-28 DIAGNOSIS — R9439 Abnormal result of other cardiovascular function study: Secondary | ICD-10-CM | POA: Diagnosis not present

## 2015-10-28 DIAGNOSIS — R5383 Other fatigue: Secondary | ICD-10-CM | POA: Diagnosis present

## 2015-10-28 DIAGNOSIS — R9431 Abnormal electrocardiogram [ECG] [EKG]: Secondary | ICD-10-CM

## 2015-10-28 DIAGNOSIS — I10 Essential (primary) hypertension: Secondary | ICD-10-CM | POA: Insufficient documentation

## 2015-10-28 LAB — MYOCARDIAL PERFUSION IMAGING
CHL CUP NUCLEAR SDS: 2
CHL CUP RESTING HR STRESS: 58 {beats}/min
CSEPPHR: 80 {beats}/min
LHR: 0.3
LV dias vol: 94 mL (ref 62–150)
LVSYSVOL: 42 mL
SRS: 3
SSS: 5
TID: 1

## 2015-10-28 MED ORDER — TECHNETIUM TC 99M TETROFOSMIN IV KIT
11.0000 | PACK | Freq: Once | INTRAVENOUS | Status: AC | PRN
  Administered 2015-10-28: 11 via INTRAVENOUS
  Filled 2015-10-28: qty 11

## 2015-10-28 MED ORDER — REGADENOSON 0.4 MG/5ML IV SOLN
0.4000 mg | Freq: Once | INTRAVENOUS | Status: AC
  Administered 2015-10-28: 0.4 mg via INTRAVENOUS

## 2015-10-28 MED ORDER — TECHNETIUM TC 99M TETROFOSMIN IV KIT
30.5000 | PACK | Freq: Once | INTRAVENOUS | Status: AC | PRN
  Administered 2015-10-28: 31 via INTRAVENOUS
  Filled 2015-10-28: qty 31

## 2015-11-13 ENCOUNTER — Other Ambulatory Visit: Payer: Self-pay | Admitting: Internal Medicine

## 2015-11-17 ENCOUNTER — Ambulatory Visit (INDEPENDENT_AMBULATORY_CARE_PROVIDER_SITE_OTHER): Payer: PRIVATE HEALTH INSURANCE

## 2015-11-17 DIAGNOSIS — E538 Deficiency of other specified B group vitamins: Secondary | ICD-10-CM

## 2015-11-17 MED ORDER — CYANOCOBALAMIN 1000 MCG/ML IJ SOLN
1000.0000 ug | Freq: Once | INTRAMUSCULAR | Status: AC
  Administered 2015-11-17: 1000 ug via INTRAMUSCULAR

## 2015-12-17 ENCOUNTER — Ambulatory Visit (INDEPENDENT_AMBULATORY_CARE_PROVIDER_SITE_OTHER): Payer: PRIVATE HEALTH INSURANCE

## 2015-12-17 DIAGNOSIS — E538 Deficiency of other specified B group vitamins: Secondary | ICD-10-CM

## 2015-12-17 MED ORDER — CYANOCOBALAMIN 1000 MCG/ML IJ SOLN
1000.0000 ug | Freq: Once | INTRAMUSCULAR | Status: AC
  Administered 2015-12-17: 1000 ug via INTRAMUSCULAR

## 2016-01-20 ENCOUNTER — Ambulatory Visit (INDEPENDENT_AMBULATORY_CARE_PROVIDER_SITE_OTHER): Payer: PRIVATE HEALTH INSURANCE | Admitting: *Deleted

## 2016-01-20 DIAGNOSIS — E538 Deficiency of other specified B group vitamins: Secondary | ICD-10-CM | POA: Diagnosis not present

## 2016-01-20 MED ORDER — CYANOCOBALAMIN 1000 MCG/ML IJ SOLN
1000.0000 ug | Freq: Once | INTRAMUSCULAR | Status: AC
  Administered 2016-01-20: 1000 ug via INTRAMUSCULAR

## 2016-02-10 ENCOUNTER — Other Ambulatory Visit: Payer: Self-pay | Admitting: Internal Medicine

## 2016-02-24 ENCOUNTER — Ambulatory Visit (INDEPENDENT_AMBULATORY_CARE_PROVIDER_SITE_OTHER): Payer: Medicare Other | Admitting: Internal Medicine

## 2016-02-24 ENCOUNTER — Encounter: Payer: Self-pay | Admitting: Internal Medicine

## 2016-02-24 ENCOUNTER — Other Ambulatory Visit (INDEPENDENT_AMBULATORY_CARE_PROVIDER_SITE_OTHER): Payer: Medicare Other

## 2016-02-24 VITALS — BP 132/82 | HR 88 | Temp 97.7°F | Resp 16 | Ht 70.0 in | Wt 219.0 lb

## 2016-02-24 DIAGNOSIS — I10 Essential (primary) hypertension: Secondary | ICD-10-CM

## 2016-02-24 DIAGNOSIS — E118 Type 2 diabetes mellitus with unspecified complications: Secondary | ICD-10-CM | POA: Diagnosis not present

## 2016-02-24 DIAGNOSIS — E538 Deficiency of other specified B group vitamins: Secondary | ICD-10-CM | POA: Diagnosis not present

## 2016-02-24 DIAGNOSIS — Z23 Encounter for immunization: Secondary | ICD-10-CM

## 2016-02-24 DIAGNOSIS — H35373 Puckering of macula, bilateral: Secondary | ICD-10-CM | POA: Diagnosis not present

## 2016-02-24 DIAGNOSIS — E113313 Type 2 diabetes mellitus with moderate nonproliferative diabetic retinopathy with macular edema, bilateral: Secondary | ICD-10-CM | POA: Diagnosis not present

## 2016-02-24 DIAGNOSIS — Z860100 Personal history of colon polyps, unspecified: Secondary | ICD-10-CM

## 2016-02-24 DIAGNOSIS — E669 Obesity, unspecified: Secondary | ICD-10-CM

## 2016-02-24 DIAGNOSIS — E66811 Obesity, class 1: Secondary | ICD-10-CM

## 2016-02-24 LAB — BASIC METABOLIC PANEL
BUN: 21 mg/dL (ref 6–23)
CHLORIDE: 102 meq/L (ref 96–112)
CO2: 30 mEq/L (ref 19–32)
CREATININE: 1.07 mg/dL (ref 0.40–1.50)
Calcium: 9.5 mg/dL (ref 8.4–10.5)
GFR: 73.71 mL/min (ref >60.00)
Glucose, Bld: 105 mg/dL — ABNORMAL HIGH (ref 70–99)
POTASSIUM: 4.2 meq/L (ref 3.5–5.1)
SODIUM: 141 meq/L (ref 135–145)

## 2016-02-24 LAB — HEMOGLOBIN A1C: Hgb A1c MFr Bld: 7.3 % — ABNORMAL HIGH (ref 4.6–6.5)

## 2016-02-24 MED ORDER — CYANOCOBALAMIN 1000 MCG/ML IJ SOLN
1000.0000 ug | INTRAMUSCULAR | Status: AC
  Administered 2016-03-25: 1000 ug via INTRAMUSCULAR

## 2016-02-24 MED ORDER — CYANOCOBALAMIN 1000 MCG/ML IJ SOLN
1000.0000 ug | Freq: Once | INTRAMUSCULAR | Status: AC
  Administered 2016-02-24: 1000 ug via INTRAMUSCULAR

## 2016-02-24 NOTE — Patient Instructions (Signed)

## 2016-02-24 NOTE — Progress Notes (Signed)
Pre visit review using our clinic review tool, if applicable. No additional management support is needed unless otherwise documented below in the visit note. 

## 2016-02-24 NOTE — Progress Notes (Signed)
Subjective:  Patient ID: Edward Macdonald, male    DOB: Dec 22, 1950  Age: 65 y.o. MRN: [id number]  CC: Hypertension and Diabetes   HPI Edward Macdonald presents for a follow-up on hypertension and diabetes. He complains of chronic numbness in both feet, more on the right than the left but offers no other symptoms. He tells me his blood sugars been relatively well controlled lately. He denies polyuria, polydipsia or polyphagia. He also tells me his blood pressure has been well-controlled with no recent episodes of headache, blurred vision, chest pain, shortness of breath, palpitations, edema, or fatigue.  Outpatient Medications Prior to Visit  Medication Sig Dispense Refill  . aspirin 81 MG tablet Take 1 tablet (81 mg total) by mouth daily. 30 tablet 11  . B-D ULTRAFINE III SHORT PEN 31G X 8 MM MISC USE TO INJECT INTO THE SKIN THREE TIMES A DAY 100 each 7  . BESIVANCE 0.6 % SUSP     . bisoprolol (ZEBETA) 5 MG tablet TAKE ONE TABLET BY MOUTH EVERY DAY. 30 tablet 11  . Cyanocobalamin 500 MCG/0.1ML SOLN Place 0.1 mLs (500 mcg total) into the nose once a week. 2.3 mL 11  . DUREZOL 0.05 % EMUL     . FARXIGA 10 MG TABS tablet TAKE ONE TABLET BY MOUTH EVERY DAY 90 tablet 0  . glucose blood (FREESTYLE LITE) test strip Use to check blood sugars three times a day Dx e11.9 100 each 11  . ILEVRO 0.3 % ophthalmic suspension     . L-Methylfolate-Algae-B12-B6 (METANX) 3-90.314-2-35 MG CAPS Take 1 capsule by mouth 2 (two) times daily. 180 capsule 3  . Lancets (ONETOUCH ULTRASOFT) lancets USE THREE TIMES A DAY Dx: E11.8 100 each 12  . metFORMIN (GLUCOPHAGE) 500 MG tablet TAKE ONE TABLET BY MOUTH TWICE A DAY WITH A MEAL. 60 tablet 11  . NOVOLOG MIX 70/30 FLEXPEN (70-30) 100 UNIT/ML FlexPen INJECT 60 UNITS INTO THE SKIN SUBCUTANEOUSLY TWICE DAILY WITH A MEAL 45 mL 10  . rosuvastatin (CRESTOR) 10 MG tablet TAKE ONE TABLET BY MOUTH EVERY DAY. 30 tablet 11  . valsartan-hydrochlorothiazide (DIOVAN-HCT) 160-12.5 MG  tablet TAKE ONE TABLET BY MOUTH ONCE DAILY 90 tablet 1   No facility-administered medications prior to visit.     ROS Review of Systems  Constitutional: Negative.  Negative for appetite change, chills, fatigue and unexpected weight change.  HENT: Negative.  Negative for trouble swallowing.   Eyes: Negative.  Negative for visual disturbance.  Respiratory: Negative.  Negative for cough, choking, chest tightness, shortness of breath, wheezing and stridor.   Cardiovascular: Negative.  Negative for chest pain, palpitations and leg swelling.  Gastrointestinal: Negative.  Negative for abdominal pain, constipation, diarrhea, nausea and vomiting.  Endocrine: Negative for polydipsia, polyphagia and polyuria.  Genitourinary: Negative.  Negative for difficulty urinating and dysuria.  Musculoskeletal: Negative.  Negative for arthralgias, back pain, joint swelling, myalgias and neck pain.  Skin: Negative.  Negative for color change, pallor and rash.  Neurological: Positive for numbness. Negative for dizziness, tremors, weakness, light-headedness and headaches.  Hematological: Negative for adenopathy. Does not bruise/bleed easily.  Psychiatric/Behavioral: Negative.     Objective:  BP 132/82 (BP Location: Left Arm, Patient Position: Sitting, Cuff Size: Normal)   Pulse 88   Temp 97.7 F (36.5 C)   Resp 16   Ht 5\' 10"  (1.778 m)   Wt 219 lb (99.3 kg)   SpO2 94%   BMI 31.42 kg/m   BP Readings from Last 3 Encounters:  02/24/16 132/82  10/14/15 124/82  03/25/15 122/68    Wt Readings from Last 3 Encounters:  02/24/16 219 lb (99.3 kg)  10/28/15 219 lb (99.3 kg)  10/14/15 219 lb (99.3 kg)    Physical Exam  Constitutional: He is oriented to person, place, and time. No distress.  HENT:  Mouth/Throat: Oropharynx is clear and moist. No oropharyngeal exudate.  Eyes: Conjunctivae are normal. Right eye exhibits no discharge. Left eye exhibits no discharge. No scleral icterus.  Neck: Normal range  of motion. Neck supple. No JVD present. No tracheal deviation present. No thyromegaly present.  Cardiovascular: Normal rate, regular rhythm, normal heart sounds and intact distal pulses.  Exam reveals no gallop and no friction rub.   No murmur heard. Pulmonary/Chest: Effort normal and breath sounds normal. No stridor. No respiratory distress. He has no wheezes. He has no rales. He exhibits no tenderness.  Abdominal: Soft. Bowel sounds are normal. He exhibits no distension and no mass. There is no tenderness. There is no rebound and no guarding.  Musculoskeletal: Normal range of motion. He exhibits no edema, tenderness or deformity.  Lymphadenopathy:    He has no cervical adenopathy.  Neurological: He is oriented to person, place, and time.  Skin: Skin is warm and dry. No rash noted. He is not diaphoretic. No erythema. No pallor.  Vitals reviewed.   Lab Results  Component Value Date   WBC 6.4 10/14/2015   HGB 16.0 10/14/2015   HCT 46.2 10/14/2015   PLT 230.0 10/14/2015   GLUCOSE 105 (H) 02/24/2016   CHOL 101 10/14/2015   TRIG 176.0 (H) 10/14/2015   HDL 27.60 (L) 10/14/2015   LDLDIRECT 35.0 01/01/2014   LDLCALC 38 10/14/2015   ALT 30 10/14/2015   AST 21 10/14/2015   NA 141 02/24/2016   K 4.2 02/24/2016   CL 102 02/24/2016   CREATININE 1.07 02/24/2016   BUN 21 02/24/2016   CO2 30 02/24/2016   TSH 2.45 10/14/2015   PSA 0.26 10/14/2015   HGBA1C 7.3 (H) 02/24/2016   MICROALBUR <0.7 10/14/2015    No results found.  Assessment & Plan:   Edward Macdonald was seen today for hypertension and diabetes.  Diagnoses and all orders for this visit:  Type 2 diabetes mellitus with complication, without long-term current use of insulin ([place])- His A1c is 7.3%, this is a slight increase over his previous A1c. No medication changes were made at this time but he does agree to work more diligently on his lifestyle modifications to lower his blood sugars. -     Basic metabolic panel; Future -      Hemoglobin A1c; Future  Essential hypertension, benign- his blood pressure is well-controlled, electrolytes and renal function are stable. -     Basic metabolic panel; Future  B12 deficiency -     cyanocobalamin ((VITAMIN B-12)) injection 1,000 mcg; Inject 1 mL (1,000 mcg total) into the muscle once. -     cyanocobalamin ((VITAMIN B-12)) injection 1,000 mcg; Inject 1 mL (1,000 mcg total) into the muscle every 30 (thirty) days.  Need for prophylactic vaccination and inoculation against influenza -     Flu vaccine HIGH DOSE PF (Fluzone High dose)  History of colonic polyps -     Ambulatory referral to Gastroenterology   I am having Edward Macdonald maintain his onetouch ultrasoft, aspirin, Cyanocobalamin, NOVOLOG MIX 70/30 FLEXPEN, BESIVANCE, DUREZOL, ILEVRO, B-D ULTRAFINE III SHORT PEN, glucose blood, valsartan-hydrochlorothiazide, METANX, metFORMIN, bisoprolol, rosuvastatin, and FARXIGA. We administered cyanocobalamin. We will continue to  administer cyanocobalamin.  Meds ordered this encounter  Medications  . cyanocobalamin ((VITAMIN B-12)) injection 1,000 mcg  . cyanocobalamin ((VITAMIN B-12)) injection 1,000 mcg     Follow-up: Return in about 4 months (around 06/25/2016).  Scarlette Calico, MD

## 2016-02-26 LAB — HM DIABETES EYE EXAM

## 2016-02-27 DIAGNOSIS — E669 Obesity, unspecified: Secondary | ICD-10-CM | POA: Insufficient documentation

## 2016-02-27 NOTE — Assessment & Plan Note (Signed)
He agrees to work on his last modifications to help her lose weight.

## 2016-03-02 ENCOUNTER — Encounter: Payer: Self-pay | Admitting: Internal Medicine

## 2016-03-14 ENCOUNTER — Other Ambulatory Visit: Payer: Self-pay | Admitting: Internal Medicine

## 2016-03-25 ENCOUNTER — Ambulatory Visit (INDEPENDENT_AMBULATORY_CARE_PROVIDER_SITE_OTHER): Payer: Medicare Other

## 2016-03-25 DIAGNOSIS — E538 Deficiency of other specified B group vitamins: Secondary | ICD-10-CM | POA: Diagnosis not present

## 2016-04-06 DIAGNOSIS — H35373 Puckering of macula, bilateral: Secondary | ICD-10-CM | POA: Diagnosis not present

## 2016-04-06 DIAGNOSIS — H43813 Vitreous degeneration, bilateral: Secondary | ICD-10-CM | POA: Diagnosis not present

## 2016-04-06 DIAGNOSIS — E113313 Type 2 diabetes mellitus with moderate nonproliferative diabetic retinopathy with macular edema, bilateral: Secondary | ICD-10-CM | POA: Diagnosis not present

## 2016-04-14 ENCOUNTER — Other Ambulatory Visit: Payer: Self-pay | Admitting: Internal Medicine

## 2016-04-18 ENCOUNTER — Encounter: Payer: Self-pay | Admitting: Internal Medicine

## 2016-04-19 ENCOUNTER — Telehealth: Payer: Self-pay

## 2016-04-19 MED ORDER — ONETOUCH ULTRASOFT LANCETS MISC
3 refills | Status: AC
Start: 2016-04-19 — End: ?

## 2016-04-19 NOTE — Telephone Encounter (Signed)
rf rq for lancets. erx sent to [facility].

## 2016-04-26 ENCOUNTER — Ambulatory Visit (INDEPENDENT_AMBULATORY_CARE_PROVIDER_SITE_OTHER): Payer: Medicare Other | Admitting: General Practice

## 2016-04-26 DIAGNOSIS — E538 Deficiency of other specified B group vitamins: Secondary | ICD-10-CM | POA: Diagnosis not present

## 2016-04-26 MED ORDER — CYANOCOBALAMIN 1000 MCG/ML IJ SOLN
1000.0000 ug | Freq: Once | INTRAMUSCULAR | Status: AC
  Administered 2016-04-26: 1000 ug via INTRAMUSCULAR

## 2016-05-14 ENCOUNTER — Other Ambulatory Visit: Payer: Self-pay | Admitting: Internal Medicine

## 2016-05-17 DIAGNOSIS — E113313 Type 2 diabetes mellitus with moderate nonproliferative diabetic retinopathy with macular edema, bilateral: Secondary | ICD-10-CM | POA: Diagnosis not present

## 2016-05-17 DIAGNOSIS — H43813 Vitreous degeneration, bilateral: Secondary | ICD-10-CM | POA: Diagnosis not present

## 2016-05-17 DIAGNOSIS — H35373 Puckering of macula, bilateral: Secondary | ICD-10-CM | POA: Diagnosis not present

## 2016-05-26 ENCOUNTER — Ambulatory Visit (INDEPENDENT_AMBULATORY_CARE_PROVIDER_SITE_OTHER): Payer: Medicare Other

## 2016-05-26 DIAGNOSIS — E538 Deficiency of other specified B group vitamins: Secondary | ICD-10-CM | POA: Diagnosis not present

## 2016-05-26 MED ORDER — CYANOCOBALAMIN 1000 MCG/ML IJ SOLN
1000.0000 ug | Freq: Once | INTRAMUSCULAR | Status: AC
  Administered 2016-05-26: 1000 ug via INTRAMUSCULAR

## 2016-05-26 NOTE — Progress Notes (Signed)
Injection given.   Elisha Mcgruder J Zyria Fiscus, MD  

## 2016-06-14 ENCOUNTER — Other Ambulatory Visit: Payer: Self-pay | Admitting: Internal Medicine

## 2016-06-27 ENCOUNTER — Telehealth: Payer: Self-pay

## 2016-06-27 ENCOUNTER — Ambulatory Visit (INDEPENDENT_AMBULATORY_CARE_PROVIDER_SITE_OTHER): Payer: Medicare Other

## 2016-06-27 DIAGNOSIS — E538 Deficiency of other specified B group vitamins: Secondary | ICD-10-CM

## 2016-06-27 MED ORDER — CYANOCOBALAMIN 1000 MCG/ML IJ SOLN
1000.0000 ug | Freq: Once | INTRAMUSCULAR | Status: AC
  Administered 2016-06-27: 1000 ug via INTRAMUSCULAR

## 2016-06-27 NOTE — Telephone Encounter (Signed)
error 

## 2016-06-27 NOTE — Telephone Encounter (Signed)
Patient needs to have b12 lab, lab ordered, patient advised today----let patient know results and further instructions from dr Ronnald Ramp after lab results

## 2016-07-06 DIAGNOSIS — H35373 Puckering of macula, bilateral: Secondary | ICD-10-CM | POA: Diagnosis not present

## 2016-07-06 DIAGNOSIS — H43813 Vitreous degeneration, bilateral: Secondary | ICD-10-CM | POA: Diagnosis not present

## 2016-07-06 DIAGNOSIS — H3582 Retinal ischemia: Secondary | ICD-10-CM | POA: Diagnosis not present

## 2016-07-06 DIAGNOSIS — E113313 Type 2 diabetes mellitus with moderate nonproliferative diabetic retinopathy with macular edema, bilateral: Secondary | ICD-10-CM | POA: Diagnosis not present

## 2016-07-15 ENCOUNTER — Other Ambulatory Visit: Payer: Self-pay | Admitting: Internal Medicine

## 2016-07-26 ENCOUNTER — Other Ambulatory Visit (INDEPENDENT_AMBULATORY_CARE_PROVIDER_SITE_OTHER): Payer: Medicare Other

## 2016-07-26 DIAGNOSIS — E538 Deficiency of other specified B group vitamins: Secondary | ICD-10-CM | POA: Diagnosis not present

## 2016-07-26 LAB — VITAMIN B12

## 2016-08-24 DIAGNOSIS — H3582 Retinal ischemia: Secondary | ICD-10-CM | POA: Diagnosis not present

## 2016-08-24 DIAGNOSIS — H35373 Puckering of macula, bilateral: Secondary | ICD-10-CM | POA: Diagnosis not present

## 2016-08-24 DIAGNOSIS — H43813 Vitreous degeneration, bilateral: Secondary | ICD-10-CM | POA: Diagnosis not present

## 2016-08-24 DIAGNOSIS — E113313 Type 2 diabetes mellitus with moderate nonproliferative diabetic retinopathy with macular edema, bilateral: Secondary | ICD-10-CM | POA: Diagnosis not present

## 2016-09-13 ENCOUNTER — Other Ambulatory Visit: Payer: Self-pay | Admitting: Internal Medicine

## 2016-10-13 ENCOUNTER — Other Ambulatory Visit: Payer: Self-pay | Admitting: Internal Medicine

## 2016-10-14 DIAGNOSIS — H43813 Vitreous degeneration, bilateral: Secondary | ICD-10-CM | POA: Diagnosis not present

## 2016-10-14 DIAGNOSIS — H35373 Puckering of macula, bilateral: Secondary | ICD-10-CM | POA: Diagnosis not present

## 2016-10-14 DIAGNOSIS — E113313 Type 2 diabetes mellitus with moderate nonproliferative diabetic retinopathy with macular edema, bilateral: Secondary | ICD-10-CM | POA: Diagnosis not present

## 2016-10-14 DIAGNOSIS — H3582 Retinal ischemia: Secondary | ICD-10-CM | POA: Diagnosis not present

## 2016-10-21 ENCOUNTER — Encounter: Payer: Self-pay | Admitting: Gastroenterology

## 2016-10-27 ENCOUNTER — Encounter: Payer: Medicare Other | Admitting: Internal Medicine

## 2016-11-07 DIAGNOSIS — E784 Other hyperlipidemia: Secondary | ICD-10-CM | POA: Diagnosis not present

## 2016-11-07 DIAGNOSIS — E538 Deficiency of other specified B group vitamins: Secondary | ICD-10-CM | POA: Diagnosis not present

## 2016-11-07 DIAGNOSIS — H3589 Other specified retinal disorders: Secondary | ICD-10-CM | POA: Diagnosis not present

## 2016-11-07 DIAGNOSIS — Z6831 Body mass index (BMI) 31.0-31.9, adult: Secondary | ICD-10-CM | POA: Diagnosis not present

## 2016-11-07 DIAGNOSIS — E083213 Diabetes mellitus due to underlying condition with mild nonproliferative diabetic retinopathy with macular edema, bilateral: Secondary | ICD-10-CM | POA: Diagnosis not present

## 2016-11-07 DIAGNOSIS — I10 Essential (primary) hypertension: Secondary | ICD-10-CM | POA: Diagnosis not present

## 2016-11-07 DIAGNOSIS — Z1389 Encounter for screening for other disorder: Secondary | ICD-10-CM | POA: Diagnosis not present

## 2016-12-07 DIAGNOSIS — H43813 Vitreous degeneration, bilateral: Secondary | ICD-10-CM | POA: Diagnosis not present

## 2016-12-07 DIAGNOSIS — H3582 Retinal ischemia: Secondary | ICD-10-CM | POA: Diagnosis not present

## 2016-12-07 DIAGNOSIS — E113313 Type 2 diabetes mellitus with moderate nonproliferative diabetic retinopathy with macular edema, bilateral: Secondary | ICD-10-CM | POA: Diagnosis not present

## 2016-12-07 DIAGNOSIS — H35373 Puckering of macula, bilateral: Secondary | ICD-10-CM | POA: Diagnosis not present

## 2016-12-15 ENCOUNTER — Telehealth: Payer: Self-pay | Admitting: Internal Medicine

## 2016-12-15 NOTE — Telephone Encounter (Signed)
LM with pt to schedule a Welcome to Medicare visit before 01/29/16.

## 2017-02-01 DIAGNOSIS — H3582 Retinal ischemia: Secondary | ICD-10-CM | POA: Diagnosis not present

## 2017-02-01 DIAGNOSIS — E113313 Type 2 diabetes mellitus with moderate nonproliferative diabetic retinopathy with macular edema, bilateral: Secondary | ICD-10-CM | POA: Diagnosis not present

## 2017-02-01 DIAGNOSIS — H35373 Puckering of macula, bilateral: Secondary | ICD-10-CM | POA: Diagnosis not present

## 2017-02-01 DIAGNOSIS — H43813 Vitreous degeneration, bilateral: Secondary | ICD-10-CM | POA: Diagnosis not present

## 2017-03-07 DIAGNOSIS — E7849 Other hyperlipidemia: Secondary | ICD-10-CM | POA: Diagnosis not present

## 2017-03-07 DIAGNOSIS — I10 Essential (primary) hypertension: Secondary | ICD-10-CM | POA: Diagnosis not present

## 2017-03-07 DIAGNOSIS — Z23 Encounter for immunization: Secondary | ICD-10-CM | POA: Diagnosis not present

## 2017-03-07 DIAGNOSIS — E083213 Diabetes mellitus due to underlying condition with mild nonproliferative diabetic retinopathy with macular edema, bilateral: Secondary | ICD-10-CM | POA: Diagnosis not present

## 2017-03-07 DIAGNOSIS — Z6831 Body mass index (BMI) 31.0-31.9, adult: Secondary | ICD-10-CM | POA: Diagnosis not present

## 2017-03-13 ENCOUNTER — Other Ambulatory Visit: Payer: Self-pay | Admitting: Internal Medicine

## 2017-03-15 ENCOUNTER — Encounter: Payer: Self-pay | Admitting: Gastroenterology

## 2017-03-29 DIAGNOSIS — H35373 Puckering of macula, bilateral: Secondary | ICD-10-CM | POA: Diagnosis not present

## 2017-03-29 DIAGNOSIS — H43813 Vitreous degeneration, bilateral: Secondary | ICD-10-CM | POA: Diagnosis not present

## 2017-03-29 DIAGNOSIS — H3582 Retinal ischemia: Secondary | ICD-10-CM | POA: Diagnosis not present

## 2017-03-29 DIAGNOSIS — E113313 Type 2 diabetes mellitus with moderate nonproliferative diabetic retinopathy with macular edema, bilateral: Secondary | ICD-10-CM | POA: Diagnosis not present

## 2017-04-17 ENCOUNTER — Other Ambulatory Visit: Payer: Self-pay

## 2017-04-17 ENCOUNTER — Ambulatory Visit (AMBULATORY_SURGERY_CENTER): Payer: Self-pay

## 2017-04-17 ENCOUNTER — Other Ambulatory Visit: Payer: Self-pay | Admitting: Internal Medicine

## 2017-04-17 VITALS — Ht 70.0 in | Wt 222.6 lb

## 2017-04-17 DIAGNOSIS — Z86010 Personal history of colonic polyps: Secondary | ICD-10-CM

## 2017-04-17 MED ORDER — NA SULFATE-K SULFATE-MG SULF 17.5-3.13-1.6 GM/177ML PO SOLN: 1.0000 | Freq: Once | ORAL | 0 refills | Status: AC

## 2017-04-17 NOTE — Telephone Encounter (Signed)
Not able to fill rx. Pt not seen a year.

## 2017-04-17 NOTE — Progress Notes (Signed)
Denies allergies to eggs or soy products. Denies complication of anesthesia or sedation. Denies use of weight loss medication. Denies use of O2.   Emmi instructions declined.  

## 2017-04-19 ENCOUNTER — Other Ambulatory Visit: Payer: Self-pay | Admitting: Internal Medicine

## 2017-04-23 ENCOUNTER — Other Ambulatory Visit: Payer: Self-pay | Admitting: Internal Medicine

## 2017-05-01 IMAGING — NM NM MISC PROCEDURE
3 series · 18 of 18 positions shown · non-contrast
Comparison: none

[Series 1: stress-sum-em_(id)_sa · 6.4mm · 6.40mm/px · 6 of 64 frames shown]
[frame 6/64]
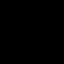
[frame 16/64]
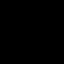
[frame 27/64]
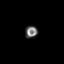
[frame 38/64]
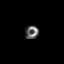
[frame 48/64]
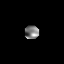
[frame 59/64]
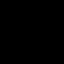

[Series 1: stress-gsp_(id)_sa · 6.4mm · 6.40mm/px · 6 of 512 frames shown]
[frame 43/512]
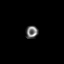
[frame 128/512]
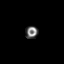
[frame 214/512]
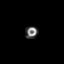
[frame 299/512]
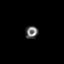
[frame 384/512]
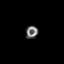
[frame 470/512]
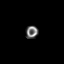

[Series 1: rest_(id)_sa · 6.4mm · 6.40mm/px · 6 of 64 frames shown]
[frame 6/64]
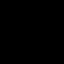
[frame 16/64]
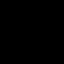
[frame 27/64]
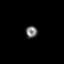
[frame 38/64]
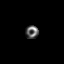
[frame 48/64]
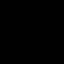
[frame 59/64]
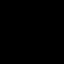

[18 of 18 positions shown; findings below may reference images not displayed]

Canned report from images found in remote index.

Refer to host system for actual result text.

## 2017-05-05 ENCOUNTER — Ambulatory Visit (AMBULATORY_SURGERY_CENTER): Payer: Medicare Other | Admitting: Gastroenterology

## 2017-05-05 ENCOUNTER — Encounter: Payer: Self-pay | Admitting: Gastroenterology

## 2017-05-05 ENCOUNTER — Other Ambulatory Visit: Payer: Self-pay

## 2017-05-05 VITALS — BP 101/65 | HR 62 | Temp 97.1°F | Resp 19 | Ht 70.0 in | Wt 222.0 lb

## 2017-05-05 DIAGNOSIS — Z86010 Personal history of colonic polyps: Secondary | ICD-10-CM

## 2017-05-05 DIAGNOSIS — E119 Type 2 diabetes mellitus without complications: Secondary | ICD-10-CM | POA: Diagnosis not present

## 2017-05-05 DIAGNOSIS — D123 Benign neoplasm of transverse colon: Secondary | ICD-10-CM

## 2017-05-05 DIAGNOSIS — I10 Essential (primary) hypertension: Secondary | ICD-10-CM | POA: Diagnosis not present

## 2017-05-05 DIAGNOSIS — D122 Benign neoplasm of ascending colon: Secondary | ICD-10-CM | POA: Diagnosis not present

## 2017-05-05 DIAGNOSIS — D128 Benign neoplasm of rectum: Secondary | ICD-10-CM | POA: Diagnosis not present

## 2017-05-05 MED ORDER — SODIUM CHLORIDE 0.9 % IV SOLN
500.0000 mL | INTRAVENOUS | Status: DC
Start: 2017-05-05 — End: 2020-12-16

## 2017-05-05 NOTE — Progress Notes (Signed)
Report to PACU, RN, vss, BBS= Clear.  

## 2017-05-05 NOTE — Op Note (Signed)
[facility] Patient Name: Edward Macdonald Procedure Date: 05/05/2017 8:21 AM MRN: [id number] Endoscopist: Mauri Pole , MD Age: 66 Referring MD:  Date of Birth: 05/01/1951 Gender: Male Account #: [id number] Procedure:                Colonoscopy Indications:              High risk colon cancer surveillance: Personal                            history of colonic polyps, High risk colon cancer                            surveillance: Personal history of adenoma less than                            10 mm in size Medicines:                Monitored Anesthesia Care Procedure:                Pre-Anesthesia Assessment:                           - Prior to the procedure, a History and Physical                            was performed, and patient medications and                            allergies were reviewed. The patient's tolerance of                            previous anesthesia was also reviewed. The risks                            and benefits of the procedure and the sedation                            options and risks were discussed with the patient.                            All questions were answered, and informed consent                            was obtained. Prior Anticoagulants: The patient has                            taken no previous anticoagulant or antiplatelet                            agents. ASA Grade Assessment: II - A patient with                            mild systemic disease. After reviewing the risks  and benefits, the patient was deemed in                            satisfactory condition to undergo the procedure.                           After obtaining informed consent, the colonoscope                            was passed under direct vision. Throughout the                            procedure, the patient's blood pressure, pulse, and                            oxygen saturations were monitored continuously.  The                            Colonoscope was introduced through the anus and                            advanced to the the cecum, identified by                            appendiceal orifice and ileocecal valve. The                            colonoscopy was performed without difficulty. The                            patient tolerated the procedure well. The quality                            of the bowel preparation was excellent. The                            ileocecal valve, appendiceal orifice, and rectum                            were photographed. Scope In: 8:24:54 AM Scope Out: 8:45:30 AM Scope Withdrawal Time: 0 hours 16 minutes 3 seconds  Total Procedure Duration: 0 hours 20 minutes 36 seconds  Findings:                 The perianal and digital rectal examinations were                            normal.                           Five sessile polyps were found in the rectum,                            transverse colon and ascending colon. The polyps  were 4 to 9 mm in size. These polyps were removed                            with a cold snare. Resection and retrieval were                            complete.                           Multiple small and large-mouthed diverticula were                            found in the sigmoid colon and descending colon.                            There was evidence of diverticular spasm. There was                            no evidence of diverticular bleeding.                           Non-bleeding internal hemorrhoids were found during                            retroflexion. The hemorrhoids were medium-sized. Complications:            No immediate complications. Estimated Blood Loss:     Estimated blood loss was minimal. Impression:               - Five 4 to 9 mm polyps in the rectum, in the                            transverse colon and in the ascending colon,                            removed with a  cold snare. Resected and retrieved.                           - Moderate diverticulosis in the sigmoid colon and                            in the descending colon. There was evidence of                            diverticular spasm. There was no evidence of                            diverticular bleeding.                           - Non-bleeding internal hemorrhoids. Recommendation:           - Patient has a contact number available for  emergencies. The signs and symptoms of potential                            delayed complications were discussed with the                            patient. Return to normal activities tomorrow.                            Written discharge instructions were provided to the                            patient.                           - Resume previous diet.                           - Continue present medications.                           - Await pathology results.                           - Repeat colonoscopy in 3 - 5 years for                            surveillance based on pathology results. Mauri Pole, MD 05/05/2017 8:52:37 AM This report has been signed electronically.

## 2017-05-05 NOTE — Progress Notes (Signed)
Pt's states no medical or surgical changes since previsit or office visit. 

## 2017-05-05 NOTE — Patient Instructions (Signed)
**   Handouts given on polyps and diverticulosis **   YOU HAD AN ENDOSCOPIC PROCEDURE TODAY AT THE [facility]:   Refer to the procedure report that was given to you for any specific questions about what was found during the examination.  If the procedure report does not answer your questions, please call your gastroenterologist to clarify.  If you requested that your care partner not be given the details of your procedure findings, then the procedure report has been included in a sealed envelope for you to review at your convenience later.  YOU SHOULD EXPECT: Some feelings of bloating in the abdomen. Passage of more gas than usual.  Walking can help get rid of the air that was put into your GI tract during the procedure and reduce the bloating. If you had a lower endoscopy (such as a colonoscopy or flexible sigmoidoscopy) you may notice spotting of blood in your stool or on the toilet paper. If you underwent a bowel prep for your procedure, you may not have a normal bowel movement for a few days.  Please Note:  You might notice some irritation and congestion in your nose or some drainage.  This is from the oxygen used during your procedure.  There is no need for concern and it should clear up in a day or so.  SYMPTOMS TO REPORT IMMEDIATELY:   Following lower endoscopy (colonoscopy or flexible sigmoidoscopy):  Excessive amounts of blood in the stool  Significant tenderness or worsening of abdominal pains  Swelling of the abdomen that is new, acute  Fever of 100F or higher  For urgent or emergent issues, a gastroenterologist can be reached at any hour by calling [phone].   DIET:  We do recommend a small meal at first, but then you may proceed to your regular diet.  Drink plenty of fluids but you should avoid alcoholic beverages for 24 hours.  ACTIVITY:  You should plan to take it easy for the rest of today and you should NOT DRIVE or use heavy machinery until tomorrow (because  of the sedation medicines used during the test).    FOLLOW UP: Our staff will call the number listed on your records the next business day following your procedure to check on you and address any questions or concerns that you may have regarding the information given to you following your procedure. If we do not reach you, we will leave a message.  However, if you are feeling well and you are not experiencing any problems, there is no need to return our call.  We will assume that you have returned to your regular daily activities without incident.  If any biopsies were taken you will be contacted by phone or by letter within the next 1-3 weeks.  Please call us at [phone] if you have not heard about the biopsies in 3 weeks.    SIGNATURES/CONFIDENTIALITY: You and/or your care partner have signed paperwork which will be entered into your electronic medical record.  These signatures attest to the fact that that the information above on your After Visit Summary has been reviewed and is understood.  Full responsibility of the confidentiality of this discharge information lies with you and/or your care-partner. 

## 2017-05-05 NOTE — Progress Notes (Signed)
Called to room to assist during endoscopic procedure.  Patient ID and intended procedure confirmed with present staff. Received instructions for my participation in the procedure from the performing physician.  

## 2017-05-06 LAB — HM COLONOSCOPY

## 2017-05-10 ENCOUNTER — Telehealth: Payer: Self-pay

## 2017-05-10 ENCOUNTER — Encounter: Payer: Self-pay | Admitting: Gastroenterology

## 2017-05-10 ENCOUNTER — Telehealth: Payer: Self-pay | Admitting: *Deleted

## 2017-05-10 NOTE — Telephone Encounter (Signed)
Attempted to reach patient for post-procedure f/u call. No answer. Left message that we will attempt to reach him again later today and for him to please not hesitate to call us if he has any questions/concerns regarding his care. 

## 2017-05-10 NOTE — Telephone Encounter (Signed)
  Follow up Call-  Call back number 05/05/2017  Post procedure Call Back phone  # [phone]  Permission to leave phone message Yes  Some recent data might be hidden     Patient questions:  Do you have a fever, pain , or abdominal swelling? No. Pain Score  0 *  Have you tolerated food without any problems? Yes.    Have you been able to return to your normal activities? Yes.    Do you have any questions about your discharge instructions: Diet   No. Medications  No. Follow up visit  No.  Do you have questions or concerns about your Care? No.  Actions: * If pain score is 4 or above: No action needed, pain <4.

## 2017-05-30 ENCOUNTER — Other Ambulatory Visit: Payer: Self-pay | Admitting: Internal Medicine

## 2017-05-31 DIAGNOSIS — H43813 Vitreous degeneration, bilateral: Secondary | ICD-10-CM | POA: Diagnosis not present

## 2017-05-31 DIAGNOSIS — H35373 Puckering of macula, bilateral: Secondary | ICD-10-CM | POA: Diagnosis not present

## 2017-05-31 DIAGNOSIS — H3582 Retinal ischemia: Secondary | ICD-10-CM | POA: Diagnosis not present

## 2017-05-31 DIAGNOSIS — E113313 Type 2 diabetes mellitus with moderate nonproliferative diabetic retinopathy with macular edema, bilateral: Secondary | ICD-10-CM | POA: Diagnosis not present

## 2017-06-19 DIAGNOSIS — E7849 Other hyperlipidemia: Secondary | ICD-10-CM | POA: Diagnosis not present

## 2017-06-19 DIAGNOSIS — I10 Essential (primary) hypertension: Secondary | ICD-10-CM | POA: Diagnosis not present

## 2017-06-19 DIAGNOSIS — Z6831 Body mass index (BMI) 31.0-31.9, adult: Secondary | ICD-10-CM | POA: Diagnosis not present

## 2017-06-19 DIAGNOSIS — E083213 Diabetes mellitus due to underlying condition with mild nonproliferative diabetic retinopathy with macular edema, bilateral: Secondary | ICD-10-CM | POA: Diagnosis not present

## 2017-06-19 DIAGNOSIS — E668 Other obesity: Secondary | ICD-10-CM | POA: Diagnosis not present

## 2017-06-19 DIAGNOSIS — H3589 Other specified retinal disorders: Secondary | ICD-10-CM | POA: Diagnosis not present

## 2017-06-19 DIAGNOSIS — Z794 Long term (current) use of insulin: Secondary | ICD-10-CM | POA: Diagnosis not present

## 2017-06-19 DIAGNOSIS — Z1389 Encounter for screening for other disorder: Secondary | ICD-10-CM | POA: Diagnosis not present

## 2017-07-19 DIAGNOSIS — H3582 Retinal ischemia: Secondary | ICD-10-CM | POA: Diagnosis not present

## 2017-07-19 DIAGNOSIS — H43813 Vitreous degeneration, bilateral: Secondary | ICD-10-CM | POA: Diagnosis not present

## 2017-07-19 DIAGNOSIS — H35373 Puckering of macula, bilateral: Secondary | ICD-10-CM | POA: Diagnosis not present

## 2017-07-19 DIAGNOSIS — E113313 Type 2 diabetes mellitus with moderate nonproliferative diabetic retinopathy with macular edema, bilateral: Secondary | ICD-10-CM | POA: Diagnosis not present

## 2017-07-21 ENCOUNTER — Other Ambulatory Visit: Payer: Self-pay | Admitting: Internal Medicine

## 2017-07-22 ENCOUNTER — Other Ambulatory Visit: Payer: Self-pay | Admitting: Internal Medicine

## 2017-07-30 ENCOUNTER — Other Ambulatory Visit: Payer: Self-pay | Admitting: Internal Medicine

## 2017-09-13 DIAGNOSIS — H43813 Vitreous degeneration, bilateral: Secondary | ICD-10-CM | POA: Diagnosis not present

## 2017-09-13 DIAGNOSIS — H3582 Retinal ischemia: Secondary | ICD-10-CM | POA: Diagnosis not present

## 2017-09-13 DIAGNOSIS — E113313 Type 2 diabetes mellitus with moderate nonproliferative diabetic retinopathy with macular edema, bilateral: Secondary | ICD-10-CM | POA: Diagnosis not present

## 2017-09-13 DIAGNOSIS — H35373 Puckering of macula, bilateral: Secondary | ICD-10-CM | POA: Diagnosis not present

## 2017-09-21 ENCOUNTER — Other Ambulatory Visit: Payer: Self-pay | Admitting: Internal Medicine

## 2017-10-17 DIAGNOSIS — E7849 Other hyperlipidemia: Secondary | ICD-10-CM | POA: Diagnosis not present

## 2017-10-17 DIAGNOSIS — H6123 Impacted cerumen, bilateral: Secondary | ICD-10-CM | POA: Diagnosis not present

## 2017-10-17 DIAGNOSIS — E083213 Diabetes mellitus due to underlying condition with mild nonproliferative diabetic retinopathy with macular edema, bilateral: Secondary | ICD-10-CM | POA: Diagnosis not present

## 2017-10-17 DIAGNOSIS — Z794 Long term (current) use of insulin: Secondary | ICD-10-CM | POA: Diagnosis not present

## 2017-10-17 DIAGNOSIS — E538 Deficiency of other specified B group vitamins: Secondary | ICD-10-CM | POA: Diagnosis not present

## 2017-10-17 DIAGNOSIS — I10 Essential (primary) hypertension: Secondary | ICD-10-CM | POA: Diagnosis not present

## 2017-10-17 DIAGNOSIS — Z6831 Body mass index (BMI) 31.0-31.9, adult: Secondary | ICD-10-CM | POA: Diagnosis not present

## 2017-10-17 DIAGNOSIS — H359 Unspecified retinal disorder: Secondary | ICD-10-CM | POA: Diagnosis not present

## 2017-10-25 DIAGNOSIS — Z6831 Body mass index (BMI) 31.0-31.9, adult: Secondary | ICD-10-CM | POA: Diagnosis not present

## 2017-10-25 DIAGNOSIS — H9313 Tinnitus, bilateral: Secondary | ICD-10-CM | POA: Diagnosis not present

## 2017-10-31 DIAGNOSIS — H3582 Retinal ischemia: Secondary | ICD-10-CM | POA: Diagnosis not present

## 2017-10-31 DIAGNOSIS — E113313 Type 2 diabetes mellitus with moderate nonproliferative diabetic retinopathy with macular edema, bilateral: Secondary | ICD-10-CM | POA: Diagnosis not present

## 2017-10-31 DIAGNOSIS — H43813 Vitreous degeneration, bilateral: Secondary | ICD-10-CM | POA: Diagnosis not present

## 2017-10-31 DIAGNOSIS — H35373 Puckering of macula, bilateral: Secondary | ICD-10-CM | POA: Diagnosis not present

## 2017-11-02 ENCOUNTER — Other Ambulatory Visit: Payer: Self-pay | Admitting: Internal Medicine

## 2017-11-13 DIAGNOSIS — E113313 Type 2 diabetes mellitus with moderate nonproliferative diabetic retinopathy with macular edema, bilateral: Secondary | ICD-10-CM | POA: Diagnosis not present

## 2017-11-13 DIAGNOSIS — H35372 Puckering of macula, left eye: Secondary | ICD-10-CM | POA: Diagnosis not present

## 2017-11-13 DIAGNOSIS — Z794 Long term (current) use of insulin: Secondary | ICD-10-CM | POA: Diagnosis not present

## 2017-11-13 DIAGNOSIS — H26491 Other secondary cataract, right eye: Secondary | ICD-10-CM | POA: Diagnosis not present

## 2017-11-24 ENCOUNTER — Other Ambulatory Visit: Payer: Self-pay | Admitting: Internal Medicine

## 2017-12-01 ENCOUNTER — Other Ambulatory Visit: Payer: Self-pay | Admitting: Internal Medicine

## 2017-12-26 DIAGNOSIS — H35373 Puckering of macula, bilateral: Secondary | ICD-10-CM | POA: Diagnosis not present

## 2017-12-26 DIAGNOSIS — E113313 Type 2 diabetes mellitus with moderate nonproliferative diabetic retinopathy with macular edema, bilateral: Secondary | ICD-10-CM | POA: Diagnosis not present

## 2018-02-27 DIAGNOSIS — H43813 Vitreous degeneration, bilateral: Secondary | ICD-10-CM | POA: Diagnosis not present

## 2018-02-27 DIAGNOSIS — E113313 Type 2 diabetes mellitus with moderate nonproliferative diabetic retinopathy with macular edema, bilateral: Secondary | ICD-10-CM | POA: Diagnosis not present

## 2018-02-27 DIAGNOSIS — H35373 Puckering of macula, bilateral: Secondary | ICD-10-CM | POA: Diagnosis not present

## 2018-02-27 DIAGNOSIS — H3582 Retinal ischemia: Secondary | ICD-10-CM | POA: Diagnosis not present

## 2018-03-23 DIAGNOSIS — E538 Deficiency of other specified B group vitamins: Secondary | ICD-10-CM | POA: Diagnosis not present

## 2018-03-23 DIAGNOSIS — R82998 Other abnormal findings in urine: Secondary | ICD-10-CM | POA: Diagnosis not present

## 2018-03-23 DIAGNOSIS — E7849 Other hyperlipidemia: Secondary | ICD-10-CM | POA: Diagnosis not present

## 2018-03-23 DIAGNOSIS — Z125 Encounter for screening for malignant neoplasm of prostate: Secondary | ICD-10-CM | POA: Diagnosis not present

## 2018-03-23 DIAGNOSIS — E083213 Diabetes mellitus due to underlying condition with mild nonproliferative diabetic retinopathy with macular edema, bilateral: Secondary | ICD-10-CM | POA: Diagnosis not present

## 2018-03-28 DIAGNOSIS — Z1389 Encounter for screening for other disorder: Secondary | ICD-10-CM | POA: Diagnosis not present

## 2018-03-28 DIAGNOSIS — Z794 Long term (current) use of insulin: Secondary | ICD-10-CM | POA: Diagnosis not present

## 2018-03-28 DIAGNOSIS — Z23 Encounter for immunization: Secondary | ICD-10-CM | POA: Diagnosis not present

## 2018-03-28 DIAGNOSIS — Z0000 Encounter for general adult medical examination without abnormal findings: Secondary | ICD-10-CM | POA: Diagnosis not present

## 2018-03-28 DIAGNOSIS — J3489 Other specified disorders of nose and nasal sinuses: Secondary | ICD-10-CM | POA: Diagnosis not present

## 2018-03-28 DIAGNOSIS — E668 Other obesity: Secondary | ICD-10-CM | POA: Diagnosis not present

## 2018-03-28 DIAGNOSIS — Z6831 Body mass index (BMI) 31.0-31.9, adult: Secondary | ICD-10-CM | POA: Diagnosis not present

## 2018-03-28 DIAGNOSIS — E083213 Diabetes mellitus due to underlying condition with mild nonproliferative diabetic retinopathy with macular edema, bilateral: Secondary | ICD-10-CM | POA: Diagnosis not present

## 2018-03-28 DIAGNOSIS — I10 Essential (primary) hypertension: Secondary | ICD-10-CM | POA: Diagnosis not present

## 2018-03-28 DIAGNOSIS — E7849 Other hyperlipidemia: Secondary | ICD-10-CM | POA: Diagnosis not present

## 2018-04-03 DIAGNOSIS — Z1212 Encounter for screening for malignant neoplasm of rectum: Secondary | ICD-10-CM | POA: Diagnosis not present

## 2018-04-24 DIAGNOSIS — H43813 Vitreous degeneration, bilateral: Secondary | ICD-10-CM | POA: Diagnosis not present

## 2018-04-24 DIAGNOSIS — H35373 Puckering of macula, bilateral: Secondary | ICD-10-CM | POA: Diagnosis not present

## 2018-04-24 DIAGNOSIS — E113313 Type 2 diabetes mellitus with moderate nonproliferative diabetic retinopathy with macular edema, bilateral: Secondary | ICD-10-CM | POA: Diagnosis not present

## 2018-05-10 DIAGNOSIS — D225 Melanocytic nevi of trunk: Secondary | ICD-10-CM | POA: Diagnosis not present

## 2018-05-10 DIAGNOSIS — D485 Neoplasm of uncertain behavior of skin: Secondary | ICD-10-CM | POA: Diagnosis not present

## 2018-05-10 DIAGNOSIS — Z1283 Encounter for screening for malignant neoplasm of skin: Secondary | ICD-10-CM | POA: Diagnosis not present

## 2018-06-19 DIAGNOSIS — H43813 Vitreous degeneration, bilateral: Secondary | ICD-10-CM | POA: Diagnosis not present

## 2018-06-19 DIAGNOSIS — H35373 Puckering of macula, bilateral: Secondary | ICD-10-CM | POA: Diagnosis not present

## 2018-06-19 DIAGNOSIS — E113313 Type 2 diabetes mellitus with moderate nonproliferative diabetic retinopathy with macular edema, bilateral: Secondary | ICD-10-CM | POA: Diagnosis not present

## 2018-08-21 DIAGNOSIS — E113313 Type 2 diabetes mellitus with moderate nonproliferative diabetic retinopathy with macular edema, bilateral: Secondary | ICD-10-CM | POA: Diagnosis not present

## 2018-10-23 DIAGNOSIS — E113313 Type 2 diabetes mellitus with moderate nonproliferative diabetic retinopathy with macular edema, bilateral: Secondary | ICD-10-CM | POA: Diagnosis not present

## 2018-11-08 ENCOUNTER — Other Ambulatory Visit: Payer: Self-pay | Admitting: Internal Medicine

## 2018-12-18 DIAGNOSIS — E113313 Type 2 diabetes mellitus with moderate nonproliferative diabetic retinopathy with macular edema, bilateral: Secondary | ICD-10-CM | POA: Diagnosis not present

## 2018-12-18 DIAGNOSIS — H43813 Vitreous degeneration, bilateral: Secondary | ICD-10-CM | POA: Diagnosis not present

## 2018-12-18 DIAGNOSIS — H35373 Puckering of macula, bilateral: Secondary | ICD-10-CM | POA: Diagnosis not present

## 2018-12-18 DIAGNOSIS — H3582 Retinal ischemia: Secondary | ICD-10-CM | POA: Diagnosis not present

## 2019-02-05 DIAGNOSIS — H359 Unspecified retinal disorder: Secondary | ICD-10-CM | POA: Diagnosis not present

## 2019-02-05 DIAGNOSIS — I10 Essential (primary) hypertension: Secondary | ICD-10-CM | POA: Diagnosis not present

## 2019-02-05 DIAGNOSIS — E083213 Diabetes mellitus due to underlying condition with mild nonproliferative diabetic retinopathy with macular edema, bilateral: Secondary | ICD-10-CM | POA: Diagnosis not present

## 2019-02-05 DIAGNOSIS — E669 Obesity, unspecified: Secondary | ICD-10-CM | POA: Diagnosis not present

## 2019-02-05 DIAGNOSIS — Z1331 Encounter for screening for depression: Secondary | ICD-10-CM | POA: Diagnosis not present

## 2019-02-05 DIAGNOSIS — Z794 Long term (current) use of insulin: Secondary | ICD-10-CM | POA: Diagnosis not present

## 2019-02-19 DIAGNOSIS — E113313 Type 2 diabetes mellitus with moderate nonproliferative diabetic retinopathy with macular edema, bilateral: Secondary | ICD-10-CM | POA: Diagnosis not present

## 2019-02-19 DIAGNOSIS — H43813 Vitreous degeneration, bilateral: Secondary | ICD-10-CM | POA: Diagnosis not present

## 2019-02-19 DIAGNOSIS — H35373 Puckering of macula, bilateral: Secondary | ICD-10-CM | POA: Diagnosis not present

## 2019-02-28 DIAGNOSIS — E083213 Diabetes mellitus due to underlying condition with mild nonproliferative diabetic retinopathy with macular edema, bilateral: Secondary | ICD-10-CM | POA: Diagnosis not present

## 2019-02-28 DIAGNOSIS — Z23 Encounter for immunization: Secondary | ICD-10-CM | POA: Diagnosis not present

## 2019-04-23 DIAGNOSIS — H43813 Vitreous degeneration, bilateral: Secondary | ICD-10-CM | POA: Diagnosis not present

## 2019-04-23 DIAGNOSIS — H35373 Puckering of macula, bilateral: Secondary | ICD-10-CM | POA: Diagnosis not present

## 2019-04-23 DIAGNOSIS — E113313 Type 2 diabetes mellitus with moderate nonproliferative diabetic retinopathy with macular edema, bilateral: Secondary | ICD-10-CM | POA: Diagnosis not present

## 2019-06-03 DIAGNOSIS — H35371 Puckering of macula, right eye: Secondary | ICD-10-CM | POA: Diagnosis not present

## 2019-06-03 DIAGNOSIS — E113311 Type 2 diabetes mellitus with moderate nonproliferative diabetic retinopathy with macular edema, right eye: Secondary | ICD-10-CM | POA: Diagnosis not present

## 2019-07-02 DIAGNOSIS — E113311 Type 2 diabetes mellitus with moderate nonproliferative diabetic retinopathy with macular edema, right eye: Secondary | ICD-10-CM | POA: Diagnosis not present

## 2019-07-02 DIAGNOSIS — H35371 Puckering of macula, right eye: Secondary | ICD-10-CM | POA: Diagnosis not present

## 2019-07-03 DIAGNOSIS — E7849 Other hyperlipidemia: Secondary | ICD-10-CM | POA: Diagnosis not present

## 2019-07-03 DIAGNOSIS — E083213 Diabetes mellitus due to underlying condition with mild nonproliferative diabetic retinopathy with macular edema, bilateral: Secondary | ICD-10-CM | POA: Diagnosis not present

## 2019-07-03 DIAGNOSIS — E538 Deficiency of other specified B group vitamins: Secondary | ICD-10-CM | POA: Diagnosis not present

## 2019-07-03 DIAGNOSIS — Z125 Encounter for screening for malignant neoplasm of prostate: Secondary | ICD-10-CM | POA: Diagnosis not present

## 2019-07-05 DIAGNOSIS — I10 Essential (primary) hypertension: Secondary | ICD-10-CM | POA: Diagnosis not present

## 2019-07-05 DIAGNOSIS — R82998 Other abnormal findings in urine: Secondary | ICD-10-CM | POA: Diagnosis not present

## 2019-07-08 ENCOUNTER — Ambulatory Visit: Payer: Medicare Other | Attending: Internal Medicine

## 2019-07-08 DIAGNOSIS — Z23 Encounter for immunization: Secondary | ICD-10-CM

## 2019-07-08 NOTE — Progress Notes (Signed)
   Covid-19 Vaccination Clinic  Name:  Edward Macdonald    MRN: [id number] DOB: 12-02-1950  07/08/2019  Edward Macdonald was observed post Covid-19 immunization for 15 minutes without incidence. He was provided with Vaccine Information Sheet and instruction to access the V-Safe system.   Edward Macdonald was instructed to call 911 with any severe reactions post vaccine: [name] Kitchen Difficulty breathing  . Swelling of your face and throat  . A fast heartbeat  . A bad rash all over your body  . Dizziness and weakness

## 2019-07-09 DIAGNOSIS — Z1339 Encounter for screening examination for other mental health and behavioral disorders: Secondary | ICD-10-CM | POA: Diagnosis not present

## 2019-07-09 DIAGNOSIS — E083213 Diabetes mellitus due to underlying condition with mild nonproliferative diabetic retinopathy with macular edema, bilateral: Secondary | ICD-10-CM | POA: Diagnosis not present

## 2019-07-09 DIAGNOSIS — E538 Deficiency of other specified B group vitamins: Secondary | ICD-10-CM | POA: Diagnosis not present

## 2019-07-09 DIAGNOSIS — I10 Essential (primary) hypertension: Secondary | ICD-10-CM | POA: Diagnosis not present

## 2019-07-09 DIAGNOSIS — Z1331 Encounter for screening for depression: Secondary | ICD-10-CM | POA: Diagnosis not present

## 2019-07-09 DIAGNOSIS — Z794 Long term (current) use of insulin: Secondary | ICD-10-CM | POA: Diagnosis not present

## 2019-07-09 DIAGNOSIS — E785 Hyperlipidemia, unspecified: Secondary | ICD-10-CM | POA: Diagnosis not present

## 2019-07-09 DIAGNOSIS — E669 Obesity, unspecified: Secondary | ICD-10-CM | POA: Diagnosis not present

## 2019-07-09 DIAGNOSIS — Z0000 Encounter for general adult medical examination without abnormal findings: Secondary | ICD-10-CM | POA: Diagnosis not present

## 2019-07-12 DIAGNOSIS — Z1212 Encounter for screening for malignant neoplasm of rectum: Secondary | ICD-10-CM | POA: Diagnosis not present

## 2019-08-02 ENCOUNTER — Ambulatory Visit: Payer: Medicare Other | Attending: Internal Medicine

## 2019-08-02 DIAGNOSIS — Z23 Encounter for immunization: Secondary | ICD-10-CM | POA: Insufficient documentation

## 2019-08-02 NOTE — Progress Notes (Signed)
   Covid-19 Vaccination Clinic  Name:  Edward Macdonald    MRN: [id number] DOB: 03/25/1951  08/02/2019  [name] was observed post Covid-19 immunization for 15 minutes without incident. He was provided with Vaccine Information Sheet and instruction to access the V-Safe system.   [name] was instructed to call 911 with any severe reactions post vaccine: [name] Kitchen Difficulty breathing  . Swelling of face and throat  . A fast heartbeat  . A bad rash all over body  . Dizziness and weakness   Immunizations Administered    Name Date Dose VIS Date Route   Pfizer COVID-19 Vaccine 08/02/2019  5:25 PM 0.3 mL 05/10/2019 Intramuscular   Manufacturer: [place]   Lot: [id number]   [place]: [id number]

## 2019-09-03 DIAGNOSIS — E113313 Type 2 diabetes mellitus with moderate nonproliferative diabetic retinopathy with macular edema, bilateral: Secondary | ICD-10-CM | POA: Diagnosis not present

## 2019-09-03 DIAGNOSIS — H43812 Vitreous degeneration, left eye: Secondary | ICD-10-CM | POA: Diagnosis not present

## 2019-09-03 DIAGNOSIS — H35372 Puckering of macula, left eye: Secondary | ICD-10-CM | POA: Diagnosis not present

## 2019-09-18 DIAGNOSIS — E113313 Type 2 diabetes mellitus with moderate nonproliferative diabetic retinopathy with macular edema, bilateral: Secondary | ICD-10-CM | POA: Diagnosis not present

## 2019-11-08 DIAGNOSIS — E538 Deficiency of other specified B group vitamins: Secondary | ICD-10-CM | POA: Diagnosis not present

## 2019-11-08 DIAGNOSIS — E669 Obesity, unspecified: Secondary | ICD-10-CM | POA: Diagnosis not present

## 2019-11-08 DIAGNOSIS — E083213 Diabetes mellitus due to underlying condition with mild nonproliferative diabetic retinopathy with macular edema, bilateral: Secondary | ICD-10-CM | POA: Diagnosis not present

## 2019-11-08 DIAGNOSIS — H359 Unspecified retinal disorder: Secondary | ICD-10-CM | POA: Diagnosis not present

## 2019-11-08 DIAGNOSIS — Z794 Long term (current) use of insulin: Secondary | ICD-10-CM | POA: Diagnosis not present

## 2019-11-08 DIAGNOSIS — I10 Essential (primary) hypertension: Secondary | ICD-10-CM | POA: Diagnosis not present

## 2019-11-08 DIAGNOSIS — E785 Hyperlipidemia, unspecified: Secondary | ICD-10-CM | POA: Diagnosis not present

## 2020-01-01 DIAGNOSIS — E113313 Type 2 diabetes mellitus with moderate nonproliferative diabetic retinopathy with macular edema, bilateral: Secondary | ICD-10-CM | POA: Diagnosis not present

## 2020-01-01 DIAGNOSIS — H43812 Vitreous degeneration, left eye: Secondary | ICD-10-CM | POA: Diagnosis not present

## 2020-01-01 DIAGNOSIS — H35372 Puckering of macula, left eye: Secondary | ICD-10-CM | POA: Diagnosis not present

## 2020-02-05 DIAGNOSIS — H35372 Puckering of macula, left eye: Secondary | ICD-10-CM | POA: Diagnosis not present

## 2020-02-05 DIAGNOSIS — E113313 Type 2 diabetes mellitus with moderate nonproliferative diabetic retinopathy with macular edema, bilateral: Secondary | ICD-10-CM | POA: Diagnosis not present

## 2020-02-05 DIAGNOSIS — H43812 Vitreous degeneration, left eye: Secondary | ICD-10-CM | POA: Diagnosis not present

## 2020-03-04 DIAGNOSIS — Z23 Encounter for immunization: Secondary | ICD-10-CM | POA: Diagnosis not present

## 2020-03-12 DIAGNOSIS — I10 Essential (primary) hypertension: Secondary | ICD-10-CM | POA: Diagnosis not present

## 2020-03-12 DIAGNOSIS — Z23 Encounter for immunization: Secondary | ICD-10-CM | POA: Diagnosis not present

## 2020-03-12 DIAGNOSIS — E669 Obesity, unspecified: Secondary | ICD-10-CM | POA: Diagnosis not present

## 2020-03-12 DIAGNOSIS — Z794 Long term (current) use of insulin: Secondary | ICD-10-CM | POA: Diagnosis not present

## 2020-03-12 DIAGNOSIS — H04121 Dry eye syndrome of right lacrimal gland: Secondary | ICD-10-CM | POA: Diagnosis not present

## 2020-03-12 DIAGNOSIS — E083213 Diabetes mellitus due to underlying condition with mild nonproliferative diabetic retinopathy with macular edema, bilateral: Secondary | ICD-10-CM | POA: Diagnosis not present

## 2020-03-12 DIAGNOSIS — M19041 Primary osteoarthritis, right hand: Secondary | ICD-10-CM | POA: Diagnosis not present

## 2020-03-12 DIAGNOSIS — E785 Hyperlipidemia, unspecified: Secondary | ICD-10-CM | POA: Diagnosis not present

## 2020-03-18 DIAGNOSIS — H35372 Puckering of macula, left eye: Secondary | ICD-10-CM | POA: Diagnosis not present

## 2020-03-18 DIAGNOSIS — E113313 Type 2 diabetes mellitus with moderate nonproliferative diabetic retinopathy with macular edema, bilateral: Secondary | ICD-10-CM | POA: Diagnosis not present

## 2020-03-18 DIAGNOSIS — H43392 Other vitreous opacities, left eye: Secondary | ICD-10-CM | POA: Diagnosis not present

## 2020-03-18 DIAGNOSIS — H43812 Vitreous degeneration, left eye: Secondary | ICD-10-CM | POA: Diagnosis not present

## 2020-04-29 DIAGNOSIS — E113313 Type 2 diabetes mellitus with moderate nonproliferative diabetic retinopathy with macular edema, bilateral: Secondary | ICD-10-CM | POA: Diagnosis not present

## 2020-06-03 DIAGNOSIS — H35372 Puckering of macula, left eye: Secondary | ICD-10-CM | POA: Diagnosis not present

## 2020-06-03 DIAGNOSIS — H43812 Vitreous degeneration, left eye: Secondary | ICD-10-CM | POA: Diagnosis not present

## 2020-06-03 DIAGNOSIS — H43392 Other vitreous opacities, left eye: Secondary | ICD-10-CM | POA: Diagnosis not present

## 2020-06-03 DIAGNOSIS — E113313 Type 2 diabetes mellitus with moderate nonproliferative diabetic retinopathy with macular edema, bilateral: Secondary | ICD-10-CM | POA: Diagnosis not present

## 2020-07-07 DIAGNOSIS — Z125 Encounter for screening for malignant neoplasm of prostate: Secondary | ICD-10-CM | POA: Diagnosis not present

## 2020-07-07 DIAGNOSIS — E113313 Type 2 diabetes mellitus with moderate nonproliferative diabetic retinopathy with macular edema, bilateral: Secondary | ICD-10-CM | POA: Diagnosis not present

## 2020-07-07 DIAGNOSIS — E083213 Diabetes mellitus due to underlying condition with mild nonproliferative diabetic retinopathy with macular edema, bilateral: Secondary | ICD-10-CM | POA: Diagnosis not present

## 2020-07-07 DIAGNOSIS — E785 Hyperlipidemia, unspecified: Secondary | ICD-10-CM | POA: Diagnosis not present

## 2020-07-07 DIAGNOSIS — E538 Deficiency of other specified B group vitamins: Secondary | ICD-10-CM | POA: Diagnosis not present

## 2020-07-14 DIAGNOSIS — E669 Obesity, unspecified: Secondary | ICD-10-CM | POA: Diagnosis not present

## 2020-07-14 DIAGNOSIS — E785 Hyperlipidemia, unspecified: Secondary | ICD-10-CM | POA: Diagnosis not present

## 2020-07-14 DIAGNOSIS — I10 Essential (primary) hypertension: Secondary | ICD-10-CM | POA: Diagnosis not present

## 2020-07-14 DIAGNOSIS — Z794 Long term (current) use of insulin: Secondary | ICD-10-CM | POA: Diagnosis not present

## 2020-07-14 DIAGNOSIS — M19041 Primary osteoarthritis, right hand: Secondary | ICD-10-CM | POA: Diagnosis not present

## 2020-07-14 DIAGNOSIS — E083213 Diabetes mellitus due to underlying condition with mild nonproliferative diabetic retinopathy with macular edema, bilateral: Secondary | ICD-10-CM | POA: Diagnosis not present

## 2020-07-14 DIAGNOSIS — R82998 Other abnormal findings in urine: Secondary | ICD-10-CM | POA: Diagnosis not present

## 2020-07-15 DIAGNOSIS — Z1212 Encounter for screening for malignant neoplasm of rectum: Secondary | ICD-10-CM | POA: Diagnosis not present

## 2020-07-16 ENCOUNTER — Encounter: Payer: Self-pay | Admitting: Gastroenterology

## 2020-08-12 DIAGNOSIS — H43821 Vitreomacular adhesion, right eye: Secondary | ICD-10-CM | POA: Diagnosis not present

## 2020-08-12 DIAGNOSIS — E113313 Type 2 diabetes mellitus with moderate nonproliferative diabetic retinopathy with macular edema, bilateral: Secondary | ICD-10-CM | POA: Diagnosis not present

## 2020-08-12 DIAGNOSIS — H43812 Vitreous degeneration, left eye: Secondary | ICD-10-CM | POA: Diagnosis not present

## 2020-08-12 DIAGNOSIS — H35372 Puckering of macula, left eye: Secondary | ICD-10-CM | POA: Diagnosis not present

## 2020-09-16 DIAGNOSIS — E113311 Type 2 diabetes mellitus with moderate nonproliferative diabetic retinopathy with macular edema, right eye: Secondary | ICD-10-CM | POA: Diagnosis not present

## 2020-09-21 ENCOUNTER — Encounter: Payer: Self-pay | Admitting: Gastroenterology

## 2020-10-14 DIAGNOSIS — E113392 Type 2 diabetes mellitus with moderate nonproliferative diabetic retinopathy without macular edema, left eye: Secondary | ICD-10-CM | POA: Diagnosis not present

## 2020-10-14 DIAGNOSIS — H43821 Vitreomacular adhesion, right eye: Secondary | ICD-10-CM | POA: Diagnosis not present

## 2020-10-14 DIAGNOSIS — H35372 Puckering of macula, left eye: Secondary | ICD-10-CM | POA: Diagnosis not present

## 2020-10-14 DIAGNOSIS — E113311 Type 2 diabetes mellitus with moderate nonproliferative diabetic retinopathy with macular edema, right eye: Secondary | ICD-10-CM | POA: Diagnosis not present

## 2020-11-17 DIAGNOSIS — J300 Vasomotor rhinitis: Secondary | ICD-10-CM | POA: Diagnosis not present

## 2020-11-17 DIAGNOSIS — E785 Hyperlipidemia, unspecified: Secondary | ICD-10-CM | POA: Diagnosis not present

## 2020-11-17 DIAGNOSIS — E083213 Diabetes mellitus due to underlying condition with mild nonproliferative diabetic retinopathy with macular edema, bilateral: Secondary | ICD-10-CM | POA: Diagnosis not present

## 2020-11-17 DIAGNOSIS — I10 Essential (primary) hypertension: Secondary | ICD-10-CM | POA: Diagnosis not present

## 2020-11-17 DIAGNOSIS — Z794 Long term (current) use of insulin: Secondary | ICD-10-CM | POA: Diagnosis not present

## 2020-11-17 DIAGNOSIS — R059 Cough, unspecified: Secondary | ICD-10-CM | POA: Diagnosis not present

## 2020-11-18 DIAGNOSIS — E113393 Type 2 diabetes mellitus with moderate nonproliferative diabetic retinopathy without macular edema, bilateral: Secondary | ICD-10-CM | POA: Diagnosis not present

## 2020-11-18 DIAGNOSIS — H43821 Vitreomacular adhesion, right eye: Secondary | ICD-10-CM | POA: Diagnosis not present

## 2020-11-18 DIAGNOSIS — H43812 Vitreous degeneration, left eye: Secondary | ICD-10-CM | POA: Diagnosis not present

## 2020-11-18 DIAGNOSIS — H35372 Puckering of macula, left eye: Secondary | ICD-10-CM | POA: Diagnosis not present

## 2020-12-02 ENCOUNTER — Ambulatory Visit (AMBULATORY_SURGERY_CENTER): Payer: Medicare Other | Admitting: *Deleted

## 2020-12-02 ENCOUNTER — Other Ambulatory Visit: Payer: Self-pay

## 2020-12-02 VITALS — Ht 70.0 in | Wt 220.0 lb

## 2020-12-02 DIAGNOSIS — Z86010 Personal history of colonic polyps: Secondary | ICD-10-CM

## 2020-12-02 MED ORDER — SUTAB 1479-225-188 MG PO TABS: 1.0000 | ORAL_TABLET | Freq: Once | ORAL | 0 refills | Status: AC

## 2020-12-02 NOTE — Progress Notes (Signed)
Pt's previsit is done over the phone and all paperwork (prep instructions, blank consent form to just read over) sent to patient.  Pt's name and DOB verified at the beginning of the previsit.  Pt denies any difficulty with ambulating.    No trouble with anesthesia, denies being told they were difficult to intubate, or hx/fam hx of malignant hyperthermia per pt    Sutab code put into RX and paper copy given to pt to show pharmacy   No egg or soy allergy  No home oxygen use   No medications for weight loss taken  Pt denies constipation issues  Pt informed that we do not do prior authorizations for prep

## 2020-12-16 ENCOUNTER — Ambulatory Visit (AMBULATORY_SURGERY_CENTER): Payer: Medicare Other | Admitting: Gastroenterology

## 2020-12-16 ENCOUNTER — Other Ambulatory Visit: Payer: Self-pay

## 2020-12-16 ENCOUNTER — Encounter: Payer: Self-pay | Admitting: Gastroenterology

## 2020-12-16 VITALS — BP 110/44 | HR 65 | Temp 98.6°F | Resp 18 | Ht 70.0 in | Wt 220.0 lb

## 2020-12-16 DIAGNOSIS — Z86010 Personal history of colonic polyps: Secondary | ICD-10-CM

## 2020-12-16 DIAGNOSIS — D123 Benign neoplasm of transverse colon: Secondary | ICD-10-CM

## 2020-12-16 DIAGNOSIS — D128 Benign neoplasm of rectum: Secondary | ICD-10-CM

## 2020-12-16 DIAGNOSIS — I10 Essential (primary) hypertension: Secondary | ICD-10-CM | POA: Diagnosis not present

## 2020-12-16 DIAGNOSIS — E119 Type 2 diabetes mellitus without complications: Secondary | ICD-10-CM | POA: Diagnosis not present

## 2020-12-16 MED ORDER — SODIUM CHLORIDE 0.9 % IV SOLN: 500.0000 mL | Freq: Once | INTRAVENOUS | Status: DC

## 2020-12-16 NOTE — Progress Notes (Signed)
Called to room to assist during endoscopic procedure.  Patient ID and intended procedure confirmed with present staff. Received instructions for my participation in the procedure from the performing physician.  

## 2020-12-16 NOTE — Patient Instructions (Signed)
Handouts given:  polyps, diverticulosis, hemorrhoids Resume previous diet Continue current medications Await pathology results  YOU HAD AN ENDOSCOPIC PROCEDURE TODAY AT [facility]:   Refer to the procedure report that was given to you for any specific questions about what was found during the examination.  If the procedure report does not answer your questions, please call your gastroenterologist to clarify.  If you requested that your care partner not be given the details of your procedure findings, then the procedure report has been included in a sealed envelope for you to review at your convenience later.  YOU SHOULD EXPECT: Some feelings of bloating in the abdomen. Passage of more gas than usual.  Walking can help get rid of the air that was put into your GI tract during the procedure and reduce the bloating. If you had a lower endoscopy (such as a colonoscopy or flexible sigmoidoscopy) you may notice spotting of blood in your stool or on the toilet paper. If you underwent a bowel prep for your procedure, you may not have a normal bowel movement for a few days.  Please Note:  You might notice some irritation and congestion in your nose or some drainage.  This is from the oxygen used during your procedure.  There is no need for concern and it should clear up in a day or so.  SYMPTOMS TO REPORT IMMEDIATELY:  Following lower endoscopy (colonoscopy or flexible sigmoidoscopy):  Excessive amounts of blood in the stool  Significant tenderness or worsening of abdominal pains  Swelling of the abdomen that is new, acute  Fever of 100F or higher  For urgent or emergent issues, a gastroenterologist can be reached at any hour by calling [phone]. Do not use MyChart messaging for urgent concerns.   DIET:  We do recommend a small meal at first, but then you may proceed to your regular diet.  Drink plenty of fluids but you should avoid alcoholic beverages for 24 hours.  ACTIVITY:   You should plan to take it easy for the rest of today and you should NOT DRIVE or use heavy machinery until tomorrow (because of the sedation medicines used during the test).    FOLLOW UP: Our staff will call the number listed on your records 48-72 hours following your procedure to check on you and address any questions or concerns that you may have regarding the information given to you following your procedure. If we do not reach you, we will leave a message.  We will attempt to reach you two times.  During this call, we will ask if you have developed any symptoms of COVID 19. If you develop any symptoms (ie: fever, flu-like symptoms, shortness of breath, cough etc.) before then, please call [phone].  If you test positive for Covid 19 in the 2 weeks post procedure, please call and report this information to [place].    If any biopsies were taken you will be contacted by phone or by letter within the next 1-3 weeks.  Please call us at [phone] if you have not heard about the biopsies in 3 weeks.   SIGNATURES/CONFIDENTIALITY: You and/or your care partner have signed paperwork which will be entered into your electronic medical record.  These signatures attest to the fact that that the information above on your After Visit Summary has been reviewed and is understood.  Full responsibility of the confidentiality of this discharge information lies with you and/or your care-partner.

## 2020-12-16 NOTE — Progress Notes (Signed)
Report given to PACU, vss 

## 2020-12-16 NOTE — Progress Notes (Signed)
Pt's states no medical or surgical changes since previsit or office visit. 

## 2020-12-16 NOTE — Op Note (Signed)
[place] Patient Name: Edward Macdonald Procedure Date: 12/16/2020 2:19 PM MRN: [id number] Endoscopist: Mauri Pole , MD Age: 70 Referring MD:  Date of Birth: December 23, 1950 Gender: Male Account #: [id number] Procedure:                Colonoscopy Indications:              High risk colon cancer surveillance: Personal                            history of colonic polyps, High risk colon cancer                            surveillance: Personal history of multiple (3 or                            more) adenomas Medicines:                Monitored Anesthesia Care Procedure:                Pre-Anesthesia Assessment:                           - Prior to the procedure, a History and Physical                            was performed, and patient medications and                            allergies were reviewed. The patient's tolerance of                            previous anesthesia was also reviewed. The risks                            and benefits of the procedure and the sedation                            options and risks were discussed with the patient.                            All questions were answered, and informed consent                            was obtained. Prior Anticoagulants: The patient has                            taken no previous anticoagulant or antiplatelet                            agents. ASA Grade Assessment: II - A patient with                            mild systemic disease. After reviewing the risks  and benefits, the patient was deemed in                            satisfactory condition to undergo the procedure.                           After obtaining informed consent, the colonoscope                            was passed under direct vision. Throughout the                            procedure, the patient's blood pressure, pulse, and                            oxygen saturations were monitored continuously. The                             Olympus PCF-H190DL ([id number]) Colonoscope was                            introduced through the anus and advanced to the the                            cecum, identified by appendiceal orifice and                            ileocecal valve. The colonoscopy was performed                            without difficulty. The patient tolerated the                            procedure well. The quality of the bowel                            preparation was good. The ileocecal valve,                            appendiceal orifice, and rectum were photographed. Scope In: 2:27:58 PM Scope Out: 2:47:17 PM Scope Withdrawal Time: 0 hours 13 minutes 8 seconds  Total Procedure Duration: 0 hours 19 minutes 19 seconds  Findings:                 The perianal and digital rectal examinations were                            normal.                           A 3 mm polyp was found in the transverse colon. The                            polyp was sessile. The polyp was removed with a  cold biopsy forceps. Resection and retrieval were                            complete.                           A 12 mm polyp was found in the rectum. The polyp                            was sessile. The polyp was removed with a hot                            snare. Resection and retrieval were complete.                           Multiple small and large-mouthed diverticula were                            found in the sigmoid colon, descending colon,                            transverse colon and ascending colon.                           Non-bleeding internal hemorrhoids were found during                            retroflexion. The hemorrhoids were medium-sized. Complications:            No immediate complications. Estimated Blood Loss:     Estimated blood loss was minimal. Impression:               - One 3 mm polyp in the transverse colon, removed                             with a cold biopsy forceps. Resected and retrieved.                           - One 12 mm polyp in the rectum, removed with a hot                            snare. Resected and retrieved.                           - Diverticulosis in the sigmoid colon, in the                            descending colon, in the transverse colon and in                            the ascending colon.                           - Non-bleeding internal hemorrhoids. Recommendation:           -  Patient has a contact number available for                            emergencies. The signs and symptoms of potential                            delayed complications were discussed with the                            patient. Return to normal activities tomorrow.                            Written discharge instructions were provided to the                            patient.                           - Resume previous diet.                           - Continue present medications.                           - Await pathology results.                           - Repeat colonoscopy in 3 years for surveillance                            based on pathology results. Mauri Pole, MD 12/16/2020 2:55:39 PM This report has been signed electronically.

## 2020-12-18 ENCOUNTER — Telehealth: Payer: Self-pay

## 2020-12-18 NOTE — Telephone Encounter (Signed)
LVM

## 2021-01-04 ENCOUNTER — Encounter: Payer: Self-pay | Admitting: Gastroenterology

## 2021-01-13 DIAGNOSIS — H35372 Puckering of macula, left eye: Secondary | ICD-10-CM | POA: Diagnosis not present

## 2021-01-13 DIAGNOSIS — H43392 Other vitreous opacities, left eye: Secondary | ICD-10-CM | POA: Diagnosis not present

## 2021-01-13 DIAGNOSIS — H43812 Vitreous degeneration, left eye: Secondary | ICD-10-CM | POA: Diagnosis not present

## 2021-01-13 DIAGNOSIS — E113313 Type 2 diabetes mellitus with moderate nonproliferative diabetic retinopathy with macular edema, bilateral: Secondary | ICD-10-CM | POA: Diagnosis not present

## 2021-02-10 DIAGNOSIS — E113312 Type 2 diabetes mellitus with moderate nonproliferative diabetic retinopathy with macular edema, left eye: Secondary | ICD-10-CM | POA: Diagnosis not present

## 2021-02-10 DIAGNOSIS — H43812 Vitreous degeneration, left eye: Secondary | ICD-10-CM | POA: Diagnosis not present

## 2021-02-10 DIAGNOSIS — E113391 Type 2 diabetes mellitus with moderate nonproliferative diabetic retinopathy without macular edema, right eye: Secondary | ICD-10-CM | POA: Diagnosis not present

## 2021-02-10 DIAGNOSIS — H35372 Puckering of macula, left eye: Secondary | ICD-10-CM | POA: Diagnosis not present

## 2021-03-12 DIAGNOSIS — Z23 Encounter for immunization: Secondary | ICD-10-CM | POA: Diagnosis not present

## 2021-03-22 DIAGNOSIS — E083213 Diabetes mellitus due to underlying condition with mild nonproliferative diabetic retinopathy with macular edema, bilateral: Secondary | ICD-10-CM | POA: Diagnosis not present

## 2021-03-22 DIAGNOSIS — E785 Hyperlipidemia, unspecified: Secondary | ICD-10-CM | POA: Diagnosis not present

## 2021-03-22 DIAGNOSIS — J300 Vasomotor rhinitis: Secondary | ICD-10-CM | POA: Diagnosis not present

## 2021-03-22 DIAGNOSIS — Z794 Long term (current) use of insulin: Secondary | ICD-10-CM | POA: Diagnosis not present

## 2021-03-22 DIAGNOSIS — Z23 Encounter for immunization: Secondary | ICD-10-CM | POA: Diagnosis not present

## 2021-03-22 DIAGNOSIS — I10 Essential (primary) hypertension: Secondary | ICD-10-CM | POA: Diagnosis not present

## 2021-03-22 DIAGNOSIS — R072 Precordial pain: Secondary | ICD-10-CM | POA: Diagnosis not present

## 2021-04-05 ENCOUNTER — Ambulatory Visit: Payer: Medicare Other | Admitting: Cardiology

## 2021-04-05 ENCOUNTER — Other Ambulatory Visit: Payer: Self-pay

## 2021-04-05 ENCOUNTER — Encounter: Payer: Self-pay | Admitting: Cardiology

## 2021-04-05 VITALS — BP 145/72 | HR 66 | Temp 97.1°F | Resp 16 | Ht 70.0 in | Wt 217.0 lb

## 2021-04-05 DIAGNOSIS — R072 Precordial pain: Secondary | ICD-10-CM | POA: Insufficient documentation

## 2021-04-05 DIAGNOSIS — I10 Essential (primary) hypertension: Secondary | ICD-10-CM | POA: Diagnosis not present

## 2021-04-05 DIAGNOSIS — E782 Mixed hyperlipidemia: Secondary | ICD-10-CM

## 2021-04-05 NOTE — Progress Notes (Signed)
Patient referred by Jarome Matin, MD for chest pain  Subjective:   Edward Macdonald, male    DOB: [birth year], 70 y.o.   MRN: [id number]   Chief Complaint  Patient presents with   Chest Pain      HPI  70 y.o. Caucasian male with hypertension, hyperlipidemia, type 2 DM, with chest pain  Patient is a full-time extrapleural, still works regularly.  His physical exercise is limited to taking 2 staircases in his house.  He does not have any chest pain or shortness of breath with regular physical activity performed.  However, he has noticed episodes of retrosternal sharp chest pain lasting for few seconds, always at rest,, in the last months.   Past Medical History:  Diagnosis Date   Allergy    Cataract    Diabetes mellitus    II   Hyperlipidemia    Hypertension    Macular retinal edema      Past Surgical History:  Procedure Laterality Date   CATARACT EXTRACTION  05/30/2014   CHOLECYSTECTOMY  05/30/1996   COLONOSCOPY     EYE SURGERY Left    scar tissue removed from around retina     Social History   Tobacco Use  Smoking Status Never  Smokeless Tobacco Never    Social History   Substance and Sexual Activity  Alcohol Use Yes   Alcohol/week: 2.0 standard drinks   Types: 2 Cans of beer per week   Comment: occasional beer     Family History  Problem Relation Age of Onset   Heart disease Mother    Diabetes Mother    Hypertension Mother    Diabetes Brother        insulin depend.   Hypertension Brother        possible d/t weight   Colon cancer Neg Hx    Esophageal cancer Neg Hx    Pancreatic cancer Neg Hx    Rectal cancer Neg Hx    Stomach cancer Neg Hx      Current Outpatient Medications on File Prior to Visit  Medication Sig Dispense Refill   aspirin 81 MG tablet Take 1 tablet (81 mg total) by mouth daily. 30 tablet 11   B-D ULTRAFINE III SHORT PEN 31G X 8 MM MISC USE TO INJECT INTO THE SKIN THREE TIMES A DAY 100 each 9   bisoprolol (ZEBETA) 5  MG tablet Take 1 tablet (5 mg total) by mouth daily. appt needed for refills 30 tablet 0   FARXIGA 10 MG TABS tablet TAKE ONE TABLET BY MOUTH EVERY DAY. 90 tablet 1   FOLBIC 2.5-25-2 MG TABS tablet TAKE ONE TABLET BY MOUTH EVERY DAY 30 tablet 2   FREESTYLE LITE test strip USE TO CHECK BLOOD SUGARS THREE TIMES A DAY 100 each 10   ipratropium (ATROVENT) 0.06 % nasal spray Place 2 sprays into both nostrils 2 (two) times daily.     JARDIANCE 25 MG TABS tablet Take 25 mg by mouth daily.     Lancets (ONETOUCH ULTRASOFT) lancets USE THREE TIMES A DAY Dx: E11.8 300 each 3   metFORMIN (GLUCOPHAGE) 500 MG tablet Take 1 tablet (500 mg total) by mouth 2 (two) times daily with a meal. appt needed for refills 60 tablet 0   NOVOLOG MIX 70/30 FLEXPEN (70-30) 100 UNIT/ML FlexPen INJECT 60 UNITS INTO THE SKIN SUBUTANEOUSLY TWICE DAILY WITH A MEAL. 45 mL 8   rosuvastatin (CRESTOR) 10 MG tablet Take 1 tablet (10 mg total) by  mouth daily. appt needed for refills 30 tablet 0   valsartan-hydrochlorothiazide (DIOVAN-HCT) 160-12.5 MG tablet Take 1 tablet by mouth daily. appt needed for refills 30 tablet 0   No current facility-administered medications on file prior to visit.    Cardiovascular and other pertinent studies:  EKG 04/05/2021: Sinus rhythm 63 bpm Possible old inferior infarct  No results found for this or any previous visit from the past 1095 days.     Recent labs: 07/07/2020: Glucose N/A, BUN/Cr 13/1.1. EGFR 66.  HbA1C 7.4% Chol 99, TG 151, HDL 31, LDL 38 TSH N/A   Review of Systems  Cardiovascular:  Positive for chest pain. Negative for dyspnea on exertion, leg swelling, palpitations and syncope.        Vitals:   04/05/21 0946  BP: (!) 145/72  Pulse: 66  Resp: 16  Temp: (!) 97.1 F (36.2 C)  SpO2: 97%     Body mass index is 31.14 kg/m. Filed Weights   04/05/21 0946  Weight: 217 lb (98.4 kg)     Objective:   Physical Exam Vitals and nursing note reviewed.  Constitutional:       General: He is not in acute distress. Neck:     Vascular: No JVD.  Cardiovascular:     Rate and Rhythm: Normal rate and regular rhythm.     Pulses: Normal pulses.     Heart sounds: Normal heart sounds. No murmur heard. Pulmonary:     Effort: Pulmonary effort is normal.     Breath sounds: Normal breath sounds. No wheezing or rales.  Musculoskeletal:     Right lower leg: No edema.     Left lower leg: No edema.         Assessment & Recommendations:   70 y.o. Caucasian male with hypertension, hyperlipidemia, type 2 DM, with chest pain  Chest pain: Atypical, but has risk factors with hypertension, hyperlipidemia, type 2 DM, family h/o CAD. Recommend exercise nuclear stress test, echocardiogram, CT cardiac scoring. Continue current management for risk factors as per Dr. Shon Baton recommendations.  Further recommendations after above testing  Thank you for referring the patient to [place]. Please feel free to contact with any questions.   Nigel Mormon, MD Pager: [phone] Office: [phone]

## 2021-04-05 NOTE — Patient Instructions (Signed)
Ex nuc Echo Calcium score PRN f/u

## 2021-04-14 DIAGNOSIS — H35372 Puckering of macula, left eye: Secondary | ICD-10-CM | POA: Diagnosis not present

## 2021-04-14 DIAGNOSIS — E113312 Type 2 diabetes mellitus with moderate nonproliferative diabetic retinopathy with macular edema, left eye: Secondary | ICD-10-CM | POA: Diagnosis not present

## 2021-04-14 DIAGNOSIS — H43812 Vitreous degeneration, left eye: Secondary | ICD-10-CM | POA: Diagnosis not present

## 2021-04-14 DIAGNOSIS — E113391 Type 2 diabetes mellitus with moderate nonproliferative diabetic retinopathy without macular edema, right eye: Secondary | ICD-10-CM | POA: Diagnosis not present

## 2021-04-19 ENCOUNTER — Other Ambulatory Visit: Payer: Medicare Other

## 2021-04-20 ENCOUNTER — Ambulatory Visit: Payer: Medicare Other

## 2021-04-20 ENCOUNTER — Other Ambulatory Visit: Payer: Self-pay

## 2021-04-20 DIAGNOSIS — R072 Precordial pain: Secondary | ICD-10-CM

## 2021-04-28 ENCOUNTER — Ambulatory Visit
Admission: RE | Admit: 2021-04-28 | Discharge: 2021-04-28 | Disposition: A | Discharge status: Home / Self Care | Payer: No Typology Code available for payment source | Source: Ambulatory Visit | Attending: Cardiology | Admitting: Cardiology

## 2021-04-28 DIAGNOSIS — E785 Hyperlipidemia, unspecified: Secondary | ICD-10-CM | POA: Diagnosis not present

## 2021-04-28 DIAGNOSIS — Z8249 Family history of ischemic heart disease and other diseases of the circulatory system: Secondary | ICD-10-CM | POA: Diagnosis not present

## 2021-04-28 DIAGNOSIS — R072 Precordial pain: Secondary | ICD-10-CM

## 2021-05-21 DIAGNOSIS — H52203 Unspecified astigmatism, bilateral: Secondary | ICD-10-CM | POA: Diagnosis not present

## 2021-05-21 DIAGNOSIS — H35372 Puckering of macula, left eye: Secondary | ICD-10-CM | POA: Diagnosis not present

## 2021-05-21 DIAGNOSIS — H26492 Other secondary cataract, left eye: Secondary | ICD-10-CM | POA: Diagnosis not present

## 2021-05-21 DIAGNOSIS — E113311 Type 2 diabetes mellitus with moderate nonproliferative diabetic retinopathy with macular edema, right eye: Secondary | ICD-10-CM | POA: Diagnosis not present

## 2021-06-01 DIAGNOSIS — H26492 Other secondary cataract, left eye: Secondary | ICD-10-CM | POA: Diagnosis not present

## 2021-06-09 DIAGNOSIS — E113313 Type 2 diabetes mellitus with moderate nonproliferative diabetic retinopathy with macular edema, bilateral: Secondary | ICD-10-CM | POA: Diagnosis not present

## 2021-06-09 DIAGNOSIS — H35372 Puckering of macula, left eye: Secondary | ICD-10-CM | POA: Diagnosis not present

## 2021-06-09 DIAGNOSIS — H43392 Other vitreous opacities, left eye: Secondary | ICD-10-CM | POA: Diagnosis not present

## 2021-06-09 DIAGNOSIS — H43812 Vitreous degeneration, left eye: Secondary | ICD-10-CM | POA: Diagnosis not present

## 2021-07-21 DIAGNOSIS — H35372 Puckering of macula, left eye: Secondary | ICD-10-CM | POA: Diagnosis not present

## 2021-07-21 DIAGNOSIS — H43812 Vitreous degeneration, left eye: Secondary | ICD-10-CM | POA: Diagnosis not present

## 2021-07-21 DIAGNOSIS — H43821 Vitreomacular adhesion, right eye: Secondary | ICD-10-CM | POA: Diagnosis not present

## 2021-07-21 DIAGNOSIS — E113313 Type 2 diabetes mellitus with moderate nonproliferative diabetic retinopathy with macular edema, bilateral: Secondary | ICD-10-CM | POA: Diagnosis not present

## 2021-08-03 DIAGNOSIS — L989 Disorder of the skin and subcutaneous tissue, unspecified: Secondary | ICD-10-CM | POA: Diagnosis not present

## 2021-08-03 DIAGNOSIS — Z125 Encounter for screening for malignant neoplasm of prostate: Secondary | ICD-10-CM | POA: Diagnosis not present

## 2021-08-03 DIAGNOSIS — Z794 Long term (current) use of insulin: Secondary | ICD-10-CM | POA: Diagnosis not present

## 2021-08-03 DIAGNOSIS — I10 Essential (primary) hypertension: Secondary | ICD-10-CM | POA: Diagnosis not present

## 2021-08-03 DIAGNOSIS — R82998 Other abnormal findings in urine: Secondary | ICD-10-CM | POA: Diagnosis not present

## 2021-08-03 DIAGNOSIS — R072 Precordial pain: Secondary | ICD-10-CM | POA: Diagnosis not present

## 2021-08-03 DIAGNOSIS — Z1339 Encounter for screening examination for other mental health and behavioral disorders: Secondary | ICD-10-CM | POA: Diagnosis not present

## 2021-08-03 DIAGNOSIS — E785 Hyperlipidemia, unspecified: Secondary | ICD-10-CM | POA: Diagnosis not present

## 2021-08-03 DIAGNOSIS — E083213 Diabetes mellitus due to underlying condition with mild nonproliferative diabetic retinopathy with macular edema, bilateral: Secondary | ICD-10-CM | POA: Diagnosis not present

## 2021-08-03 DIAGNOSIS — R5383 Other fatigue: Secondary | ICD-10-CM | POA: Diagnosis not present

## 2021-08-03 DIAGNOSIS — Z1331 Encounter for screening for depression: Secondary | ICD-10-CM | POA: Diagnosis not present

## 2021-08-03 DIAGNOSIS — Z0000 Encounter for general adult medical examination without abnormal findings: Secondary | ICD-10-CM | POA: Diagnosis not present

## 2021-08-03 DIAGNOSIS — E669 Obesity, unspecified: Secondary | ICD-10-CM | POA: Diagnosis not present

## 2021-09-01 DIAGNOSIS — H43813 Vitreous degeneration, bilateral: Secondary | ICD-10-CM | POA: Diagnosis not present

## 2021-09-01 DIAGNOSIS — H35372 Puckering of macula, left eye: Secondary | ICD-10-CM | POA: Diagnosis not present

## 2021-09-01 DIAGNOSIS — E113313 Type 2 diabetes mellitus with moderate nonproliferative diabetic retinopathy with macular edema, bilateral: Secondary | ICD-10-CM | POA: Diagnosis not present

## 2021-09-01 DIAGNOSIS — H43392 Other vitreous opacities, left eye: Secondary | ICD-10-CM | POA: Diagnosis not present

## 2021-09-17 DIAGNOSIS — E113313 Type 2 diabetes mellitus with moderate nonproliferative diabetic retinopathy with macular edema, bilateral: Secondary | ICD-10-CM | POA: Diagnosis not present

## 2021-09-22 DIAGNOSIS — D2271 Melanocytic nevi of right lower limb, including hip: Secondary | ICD-10-CM | POA: Diagnosis not present

## 2021-09-22 DIAGNOSIS — D225 Melanocytic nevi of trunk: Secondary | ICD-10-CM | POA: Diagnosis not present

## 2021-09-22 DIAGNOSIS — D2272 Melanocytic nevi of left lower limb, including hip: Secondary | ICD-10-CM | POA: Diagnosis not present

## 2021-09-22 DIAGNOSIS — L814 Other melanin hyperpigmentation: Secondary | ICD-10-CM | POA: Diagnosis not present

## 2021-09-22 DIAGNOSIS — L821 Other seborrheic keratosis: Secondary | ICD-10-CM | POA: Diagnosis not present

## 2021-09-22 DIAGNOSIS — D045 Carcinoma in situ of skin of trunk: Secondary | ICD-10-CM | POA: Diagnosis not present

## 2021-09-22 DIAGNOSIS — D485 Neoplasm of uncertain behavior of skin: Secondary | ICD-10-CM | POA: Diagnosis not present

## 2021-09-22 DIAGNOSIS — D2239 Melanocytic nevi of other parts of face: Secondary | ICD-10-CM | POA: Diagnosis not present

## 2021-10-20 DIAGNOSIS — E113313 Type 2 diabetes mellitus with moderate nonproliferative diabetic retinopathy with macular edema, bilateral: Secondary | ICD-10-CM | POA: Diagnosis not present

## 2021-10-20 DIAGNOSIS — H43812 Vitreous degeneration, left eye: Secondary | ICD-10-CM | POA: Diagnosis not present

## 2021-10-20 DIAGNOSIS — H43392 Other vitreous opacities, left eye: Secondary | ICD-10-CM | POA: Diagnosis not present

## 2021-10-20 DIAGNOSIS — H35372 Puckering of macula, left eye: Secondary | ICD-10-CM | POA: Diagnosis not present

## 2021-10-28 DIAGNOSIS — D485 Neoplasm of uncertain behavior of skin: Secondary | ICD-10-CM | POA: Diagnosis not present

## 2021-10-28 DIAGNOSIS — D045 Carcinoma in situ of skin of trunk: Secondary | ICD-10-CM | POA: Diagnosis not present

## 2021-10-28 DIAGNOSIS — L988 Other specified disorders of the skin and subcutaneous tissue: Secondary | ICD-10-CM | POA: Diagnosis not present

## 2021-11-24 DIAGNOSIS — H43812 Vitreous degeneration, left eye: Secondary | ICD-10-CM | POA: Diagnosis not present

## 2021-11-24 DIAGNOSIS — H43392 Other vitreous opacities, left eye: Secondary | ICD-10-CM | POA: Diagnosis not present

## 2021-11-24 DIAGNOSIS — H35372 Puckering of macula, left eye: Secondary | ICD-10-CM | POA: Diagnosis not present

## 2021-11-24 DIAGNOSIS — E113313 Type 2 diabetes mellitus with moderate nonproliferative diabetic retinopathy with macular edema, bilateral: Secondary | ICD-10-CM | POA: Diagnosis not present

## 2021-12-07 DIAGNOSIS — Z794 Long term (current) use of insulin: Secondary | ICD-10-CM | POA: Diagnosis not present

## 2021-12-07 DIAGNOSIS — I700 Atherosclerosis of aorta: Secondary | ICD-10-CM | POA: Diagnosis not present

## 2021-12-07 DIAGNOSIS — I10 Essential (primary) hypertension: Secondary | ICD-10-CM | POA: Diagnosis not present

## 2021-12-07 DIAGNOSIS — I2510 Atherosclerotic heart disease of native coronary artery without angina pectoris: Secondary | ICD-10-CM | POA: Diagnosis not present

## 2021-12-07 DIAGNOSIS — E785 Hyperlipidemia, unspecified: Secondary | ICD-10-CM | POA: Diagnosis not present

## 2021-12-07 DIAGNOSIS — E083213 Diabetes mellitus due to underlying condition with mild nonproliferative diabetic retinopathy with macular edema, bilateral: Secondary | ICD-10-CM | POA: Diagnosis not present

## 2021-12-28 DIAGNOSIS — E113311 Type 2 diabetes mellitus with moderate nonproliferative diabetic retinopathy with macular edema, right eye: Secondary | ICD-10-CM | POA: Diagnosis not present

## 2022-02-02 DIAGNOSIS — H43812 Vitreous degeneration, left eye: Secondary | ICD-10-CM | POA: Diagnosis not present

## 2022-02-02 DIAGNOSIS — H43392 Other vitreous opacities, left eye: Secondary | ICD-10-CM | POA: Diagnosis not present

## 2022-02-02 DIAGNOSIS — H35372 Puckering of macula, left eye: Secondary | ICD-10-CM | POA: Diagnosis not present

## 2022-02-02 DIAGNOSIS — E113313 Type 2 diabetes mellitus with moderate nonproliferative diabetic retinopathy with macular edema, bilateral: Secondary | ICD-10-CM | POA: Diagnosis not present

## 2022-03-09 DIAGNOSIS — E113311 Type 2 diabetes mellitus with moderate nonproliferative diabetic retinopathy with macular edema, right eye: Secondary | ICD-10-CM | POA: Diagnosis not present

## 2022-04-03 DIAGNOSIS — Z23 Encounter for immunization: Secondary | ICD-10-CM | POA: Diagnosis not present

## 2022-04-13 DIAGNOSIS — H43821 Vitreomacular adhesion, right eye: Secondary | ICD-10-CM | POA: Diagnosis not present

## 2022-04-13 DIAGNOSIS — E113313 Type 2 diabetes mellitus with moderate nonproliferative diabetic retinopathy with macular edema, bilateral: Secondary | ICD-10-CM | POA: Diagnosis not present

## 2022-04-13 DIAGNOSIS — H35372 Puckering of macula, left eye: Secondary | ICD-10-CM | POA: Diagnosis not present

## 2022-04-13 DIAGNOSIS — H43812 Vitreous degeneration, left eye: Secondary | ICD-10-CM | POA: Diagnosis not present

## 2022-04-13 DIAGNOSIS — H43392 Other vitreous opacities, left eye: Secondary | ICD-10-CM | POA: Diagnosis not present

## 2022-04-19 DIAGNOSIS — E785 Hyperlipidemia, unspecified: Secondary | ICD-10-CM | POA: Diagnosis not present

## 2022-04-19 DIAGNOSIS — I2510 Atherosclerotic heart disease of native coronary artery without angina pectoris: Secondary | ICD-10-CM | POA: Diagnosis not present

## 2022-04-19 DIAGNOSIS — E083213 Diabetes mellitus due to underlying condition with mild nonproliferative diabetic retinopathy with macular edema, bilateral: Secondary | ICD-10-CM | POA: Diagnosis not present

## 2022-04-19 DIAGNOSIS — I700 Atherosclerosis of aorta: Secondary | ICD-10-CM | POA: Diagnosis not present

## 2022-04-19 DIAGNOSIS — Z794 Long term (current) use of insulin: Secondary | ICD-10-CM | POA: Diagnosis not present

## 2022-04-19 DIAGNOSIS — Z23 Encounter for immunization: Secondary | ICD-10-CM | POA: Diagnosis not present

## 2022-04-19 DIAGNOSIS — I10 Essential (primary) hypertension: Secondary | ICD-10-CM | POA: Diagnosis not present

## 2022-05-17 DIAGNOSIS — E113311 Type 2 diabetes mellitus with moderate nonproliferative diabetic retinopathy with macular edema, right eye: Secondary | ICD-10-CM | POA: Diagnosis not present

## 2022-06-22 DIAGNOSIS — E113513 Type 2 diabetes mellitus with proliferative diabetic retinopathy with macular edema, bilateral: Secondary | ICD-10-CM | POA: Diagnosis not present

## 2022-06-22 DIAGNOSIS — H35372 Puckering of macula, left eye: Secondary | ICD-10-CM | POA: Diagnosis not present

## 2022-06-22 DIAGNOSIS — H43812 Vitreous degeneration, left eye: Secondary | ICD-10-CM | POA: Diagnosis not present

## 2022-07-27 DIAGNOSIS — E113511 Type 2 diabetes mellitus with proliferative diabetic retinopathy with macular edema, right eye: Secondary | ICD-10-CM | POA: Diagnosis not present

## 2022-08-31 DIAGNOSIS — E113513 Type 2 diabetes mellitus with proliferative diabetic retinopathy with macular edema, bilateral: Secondary | ICD-10-CM | POA: Diagnosis not present

## 2022-08-31 DIAGNOSIS — H43812 Vitreous degeneration, left eye: Secondary | ICD-10-CM | POA: Diagnosis not present

## 2022-08-31 DIAGNOSIS — H35372 Puckering of macula, left eye: Secondary | ICD-10-CM | POA: Diagnosis not present

## 2022-09-13 DIAGNOSIS — I10 Essential (primary) hypertension: Secondary | ICD-10-CM | POA: Diagnosis not present

## 2022-09-13 DIAGNOSIS — E538 Deficiency of other specified B group vitamins: Secondary | ICD-10-CM | POA: Diagnosis not present

## 2022-09-13 DIAGNOSIS — R7989 Other specified abnormal findings of blood chemistry: Secondary | ICD-10-CM | POA: Diagnosis not present

## 2022-09-13 DIAGNOSIS — E785 Hyperlipidemia, unspecified: Secondary | ICD-10-CM | POA: Diagnosis not present

## 2022-09-13 DIAGNOSIS — Z1212 Encounter for screening for malignant neoplasm of rectum: Secondary | ICD-10-CM | POA: Diagnosis not present

## 2022-09-14 DIAGNOSIS — E113511 Type 2 diabetes mellitus with proliferative diabetic retinopathy with macular edema, right eye: Secondary | ICD-10-CM | POA: Diagnosis not present

## 2022-09-20 DIAGNOSIS — I739 Peripheral vascular disease, unspecified: Secondary | ICD-10-CM | POA: Diagnosis not present

## 2022-09-20 DIAGNOSIS — E785 Hyperlipidemia, unspecified: Secondary | ICD-10-CM | POA: Diagnosis not present

## 2022-09-20 DIAGNOSIS — Z794 Long term (current) use of insulin: Secondary | ICD-10-CM | POA: Diagnosis not present

## 2022-09-20 DIAGNOSIS — R82998 Other abnormal findings in urine: Secondary | ICD-10-CM | POA: Diagnosis not present

## 2022-09-20 DIAGNOSIS — I700 Atherosclerosis of aorta: Secondary | ICD-10-CM | POA: Diagnosis not present

## 2022-09-20 DIAGNOSIS — I2510 Atherosclerotic heart disease of native coronary artery without angina pectoris: Secondary | ICD-10-CM | POA: Diagnosis not present

## 2022-09-20 DIAGNOSIS — I10 Essential (primary) hypertension: Secondary | ICD-10-CM | POA: Diagnosis not present

## 2022-09-20 DIAGNOSIS — E1151 Type 2 diabetes mellitus with diabetic peripheral angiopathy without gangrene: Secondary | ICD-10-CM | POA: Diagnosis not present

## 2022-09-20 DIAGNOSIS — Z0000 Encounter for general adult medical examination without abnormal findings: Secondary | ICD-10-CM | POA: Diagnosis not present

## 2022-09-27 DIAGNOSIS — D2261 Melanocytic nevi of right upper limb, including shoulder: Secondary | ICD-10-CM | POA: Diagnosis not present

## 2022-09-27 DIAGNOSIS — D485 Neoplasm of uncertain behavior of skin: Secondary | ICD-10-CM | POA: Diagnosis not present

## 2022-09-27 DIAGNOSIS — D225 Melanocytic nevi of trunk: Secondary | ICD-10-CM | POA: Diagnosis not present

## 2022-09-27 DIAGNOSIS — D1801 Hemangioma of skin and subcutaneous tissue: Secondary | ICD-10-CM | POA: Diagnosis not present

## 2022-09-27 DIAGNOSIS — D2272 Melanocytic nevi of left lower limb, including hip: Secondary | ICD-10-CM | POA: Diagnosis not present

## 2022-09-27 DIAGNOSIS — Z85828 Personal history of other malignant neoplasm of skin: Secondary | ICD-10-CM | POA: Diagnosis not present

## 2022-09-27 DIAGNOSIS — D235 Other benign neoplasm of skin of trunk: Secondary | ICD-10-CM | POA: Diagnosis not present

## 2022-09-27 DIAGNOSIS — D2262 Melanocytic nevi of left upper limb, including shoulder: Secondary | ICD-10-CM | POA: Diagnosis not present

## 2022-09-27 DIAGNOSIS — L821 Other seborrheic keratosis: Secondary | ICD-10-CM | POA: Diagnosis not present

## 2022-09-27 DIAGNOSIS — D2271 Melanocytic nevi of right lower limb, including hip: Secondary | ICD-10-CM | POA: Diagnosis not present

## 2022-09-27 DIAGNOSIS — L570 Actinic keratosis: Secondary | ICD-10-CM | POA: Diagnosis not present

## 2022-09-27 DIAGNOSIS — L814 Other melanin hyperpigmentation: Secondary | ICD-10-CM | POA: Diagnosis not present

## 2022-10-05 DIAGNOSIS — E113511 Type 2 diabetes mellitus with proliferative diabetic retinopathy with macular edema, right eye: Secondary | ICD-10-CM | POA: Diagnosis not present

## 2022-11-09 DIAGNOSIS — E113513 Type 2 diabetes mellitus with proliferative diabetic retinopathy with macular edema, bilateral: Secondary | ICD-10-CM | POA: Diagnosis not present

## 2022-11-09 DIAGNOSIS — H43812 Vitreous degeneration, left eye: Secondary | ICD-10-CM | POA: Diagnosis not present

## 2022-11-09 DIAGNOSIS — H35372 Puckering of macula, left eye: Secondary | ICD-10-CM | POA: Diagnosis not present

## 2022-12-14 DIAGNOSIS — E113511 Type 2 diabetes mellitus with proliferative diabetic retinopathy with macular edema, right eye: Secondary | ICD-10-CM | POA: Diagnosis not present

## 2023-01-25 DIAGNOSIS — E113513 Type 2 diabetes mellitus with proliferative diabetic retinopathy with macular edema, bilateral: Secondary | ICD-10-CM | POA: Diagnosis not present

## 2023-01-25 DIAGNOSIS — H43812 Vitreous degeneration, left eye: Secondary | ICD-10-CM | POA: Diagnosis not present

## 2023-01-25 DIAGNOSIS — H35372 Puckering of macula, left eye: Secondary | ICD-10-CM | POA: Diagnosis not present

## 2023-02-13 DIAGNOSIS — H04123 Dry eye syndrome of bilateral lacrimal glands: Secondary | ICD-10-CM | POA: Diagnosis not present

## 2023-02-13 DIAGNOSIS — Z794 Long term (current) use of insulin: Secondary | ICD-10-CM | POA: Diagnosis not present

## 2023-02-13 DIAGNOSIS — B353 Tinea pedis: Secondary | ICD-10-CM | POA: Diagnosis not present

## 2023-02-13 DIAGNOSIS — Z23 Encounter for immunization: Secondary | ICD-10-CM | POA: Diagnosis not present

## 2023-02-13 DIAGNOSIS — E1151 Type 2 diabetes mellitus with diabetic peripheral angiopathy without gangrene: Secondary | ICD-10-CM | POA: Diagnosis not present

## 2023-02-13 DIAGNOSIS — I739 Peripheral vascular disease, unspecified: Secondary | ICD-10-CM | POA: Diagnosis not present

## 2023-02-13 DIAGNOSIS — I700 Atherosclerosis of aorta: Secondary | ICD-10-CM | POA: Diagnosis not present

## 2023-02-13 DIAGNOSIS — I10 Essential (primary) hypertension: Secondary | ICD-10-CM | POA: Diagnosis not present

## 2023-02-13 DIAGNOSIS — I2510 Atherosclerotic heart disease of native coronary artery without angina pectoris: Secondary | ICD-10-CM | POA: Diagnosis not present

## 2023-02-13 DIAGNOSIS — E785 Hyperlipidemia, unspecified: Secondary | ICD-10-CM | POA: Diagnosis not present

## 2023-03-01 DIAGNOSIS — E113511 Type 2 diabetes mellitus with proliferative diabetic retinopathy with macular edema, right eye: Secondary | ICD-10-CM | POA: Diagnosis not present

## 2023-04-01 DIAGNOSIS — Z23 Encounter for immunization: Secondary | ICD-10-CM | POA: Diagnosis not present

## 2023-04-05 DIAGNOSIS — E113513 Type 2 diabetes mellitus with proliferative diabetic retinopathy with macular edema, bilateral: Secondary | ICD-10-CM | POA: Diagnosis not present

## 2023-04-05 DIAGNOSIS — H35372 Puckering of macula, left eye: Secondary | ICD-10-CM | POA: Diagnosis not present

## 2023-04-05 DIAGNOSIS — H43812 Vitreous degeneration, left eye: Secondary | ICD-10-CM | POA: Diagnosis not present

## 2023-05-10 DIAGNOSIS — E113511 Type 2 diabetes mellitus with proliferative diabetic retinopathy with macular edema, right eye: Secondary | ICD-10-CM | POA: Diagnosis not present

## 2023-06-28 DIAGNOSIS — H35372 Puckering of macula, left eye: Secondary | ICD-10-CM | POA: Diagnosis not present

## 2023-06-28 DIAGNOSIS — H43812 Vitreous degeneration, left eye: Secondary | ICD-10-CM | POA: Diagnosis not present

## 2023-06-28 DIAGNOSIS — E113513 Type 2 diabetes mellitus with proliferative diabetic retinopathy with macular edema, bilateral: Secondary | ICD-10-CM | POA: Diagnosis not present

## 2023-07-03 DIAGNOSIS — E1151 Type 2 diabetes mellitus with diabetic peripheral angiopathy without gangrene: Secondary | ICD-10-CM | POA: Diagnosis not present

## 2023-08-02 DIAGNOSIS — E113511 Type 2 diabetes mellitus with proliferative diabetic retinopathy with macular edema, right eye: Secondary | ICD-10-CM | POA: Diagnosis not present

## 2023-09-13 DIAGNOSIS — H35372 Puckering of macula, left eye: Secondary | ICD-10-CM | POA: Diagnosis not present

## 2023-09-13 DIAGNOSIS — H43812 Vitreous degeneration, left eye: Secondary | ICD-10-CM | POA: Diagnosis not present

## 2023-09-13 DIAGNOSIS — E113513 Type 2 diabetes mellitus with proliferative diabetic retinopathy with macular edema, bilateral: Secondary | ICD-10-CM | POA: Diagnosis not present

## 2023-09-27 DIAGNOSIS — D225 Melanocytic nevi of trunk: Secondary | ICD-10-CM | POA: Diagnosis not present

## 2023-09-27 DIAGNOSIS — L814 Other melanin hyperpigmentation: Secondary | ICD-10-CM | POA: Diagnosis not present

## 2023-09-27 DIAGNOSIS — D485 Neoplasm of uncertain behavior of skin: Secondary | ICD-10-CM | POA: Diagnosis not present

## 2023-09-27 DIAGNOSIS — D2272 Melanocytic nevi of left lower limb, including hip: Secondary | ICD-10-CM | POA: Diagnosis not present

## 2023-09-28 DIAGNOSIS — E785 Hyperlipidemia, unspecified: Secondary | ICD-10-CM | POA: Diagnosis not present

## 2023-09-28 DIAGNOSIS — E538 Deficiency of other specified B group vitamins: Secondary | ICD-10-CM | POA: Diagnosis not present

## 2023-09-28 DIAGNOSIS — Z125 Encounter for screening for malignant neoplasm of prostate: Secondary | ICD-10-CM | POA: Diagnosis not present

## 2023-09-28 DIAGNOSIS — Z1212 Encounter for screening for malignant neoplasm of rectum: Secondary | ICD-10-CM | POA: Diagnosis not present

## 2023-09-28 DIAGNOSIS — E1151 Type 2 diabetes mellitus with diabetic peripheral angiopathy without gangrene: Secondary | ICD-10-CM | POA: Diagnosis not present

## 2023-10-05 DIAGNOSIS — E1151 Type 2 diabetes mellitus with diabetic peripheral angiopathy without gangrene: Secondary | ICD-10-CM | POA: Diagnosis not present

## 2023-10-05 DIAGNOSIS — Z0000 Encounter for general adult medical examination without abnormal findings: Secondary | ICD-10-CM | POA: Diagnosis not present

## 2023-10-05 DIAGNOSIS — Z23 Encounter for immunization: Secondary | ICD-10-CM | POA: Diagnosis not present

## 2023-10-05 DIAGNOSIS — R82998 Other abnormal findings in urine: Secondary | ICD-10-CM | POA: Diagnosis not present

## 2023-10-11 DIAGNOSIS — H43812 Vitreous degeneration, left eye: Secondary | ICD-10-CM | POA: Diagnosis not present

## 2023-10-11 DIAGNOSIS — E113513 Type 2 diabetes mellitus with proliferative diabetic retinopathy with macular edema, bilateral: Secondary | ICD-10-CM | POA: Diagnosis not present

## 2023-10-11 DIAGNOSIS — H35372 Puckering of macula, left eye: Secondary | ICD-10-CM | POA: Diagnosis not present

## 2023-10-17 DIAGNOSIS — K080 Exfoliation of teeth due to systemic causes: Secondary | ICD-10-CM | POA: Diagnosis not present

## 2023-11-08 DIAGNOSIS — E113513 Type 2 diabetes mellitus with proliferative diabetic retinopathy with macular edema, bilateral: Secondary | ICD-10-CM | POA: Diagnosis not present

## 2023-11-08 DIAGNOSIS — H43812 Vitreous degeneration, left eye: Secondary | ICD-10-CM | POA: Diagnosis not present

## 2023-11-08 DIAGNOSIS — H35372 Puckering of macula, left eye: Secondary | ICD-10-CM | POA: Diagnosis not present

## 2023-12-06 DIAGNOSIS — H35372 Puckering of macula, left eye: Secondary | ICD-10-CM | POA: Diagnosis not present

## 2023-12-06 DIAGNOSIS — E113513 Type 2 diabetes mellitus with proliferative diabetic retinopathy with macular edema, bilateral: Secondary | ICD-10-CM | POA: Diagnosis not present

## 2023-12-06 DIAGNOSIS — H43812 Vitreous degeneration, left eye: Secondary | ICD-10-CM | POA: Diagnosis not present

## 2024-01-03 DIAGNOSIS — H43812 Vitreous degeneration, left eye: Secondary | ICD-10-CM | POA: Diagnosis not present

## 2024-01-03 DIAGNOSIS — E113513 Type 2 diabetes mellitus with proliferative diabetic retinopathy with macular edema, bilateral: Secondary | ICD-10-CM | POA: Diagnosis not present

## 2024-01-03 DIAGNOSIS — H35372 Puckering of macula, left eye: Secondary | ICD-10-CM | POA: Diagnosis not present

## 2024-01-31 DIAGNOSIS — H43812 Vitreous degeneration, left eye: Secondary | ICD-10-CM | POA: Diagnosis not present

## 2024-01-31 DIAGNOSIS — E113513 Type 2 diabetes mellitus with proliferative diabetic retinopathy with macular edema, bilateral: Secondary | ICD-10-CM | POA: Diagnosis not present

## 2024-01-31 DIAGNOSIS — H35372 Puckering of macula, left eye: Secondary | ICD-10-CM | POA: Diagnosis not present

## 2024-02-28 DIAGNOSIS — H35372 Puckering of macula, left eye: Secondary | ICD-10-CM | POA: Diagnosis not present

## 2024-02-28 DIAGNOSIS — H43812 Vitreous degeneration, left eye: Secondary | ICD-10-CM | POA: Diagnosis not present

## 2024-02-28 DIAGNOSIS — E113513 Type 2 diabetes mellitus with proliferative diabetic retinopathy with macular edema, bilateral: Secondary | ICD-10-CM | POA: Diagnosis not present

## 2024-04-03 DIAGNOSIS — H43812 Vitreous degeneration, left eye: Secondary | ICD-10-CM | POA: Diagnosis not present

## 2024-04-03 DIAGNOSIS — H35372 Puckering of macula, left eye: Secondary | ICD-10-CM | POA: Diagnosis not present

## 2024-04-03 DIAGNOSIS — E113513 Type 2 diabetes mellitus with proliferative diabetic retinopathy with macular edema, bilateral: Secondary | ICD-10-CM | POA: Diagnosis not present

## 2024-04-04 DIAGNOSIS — Z23 Encounter for immunization: Secondary | ICD-10-CM | POA: Diagnosis not present

## 2024-04-04 DIAGNOSIS — E1151 Type 2 diabetes mellitus with diabetic peripheral angiopathy without gangrene: Secondary | ICD-10-CM | POA: Diagnosis not present

## 2024-05-01 DIAGNOSIS — K080 Exfoliation of teeth due to systemic causes: Secondary | ICD-10-CM | POA: Diagnosis not present

## 2024-05-03 DIAGNOSIS — H43812 Vitreous degeneration, left eye: Secondary | ICD-10-CM | POA: Diagnosis not present

## 2024-05-03 DIAGNOSIS — E113513 Type 2 diabetes mellitus with proliferative diabetic retinopathy with macular edema, bilateral: Secondary | ICD-10-CM | POA: Diagnosis not present

## 2024-05-03 DIAGNOSIS — H35372 Puckering of macula, left eye: Secondary | ICD-10-CM | POA: Diagnosis not present

## 2024-06-10 ENCOUNTER — Encounter: Payer: Self-pay | Admitting: Gastroenterology

## 2024-07-03 ENCOUNTER — Encounter: Payer: Self-pay | Admitting: Gastroenterology

## 2024-07-03 ENCOUNTER — Ambulatory Visit

## 2024-07-03 VITALS — Ht 70.0 in | Wt 220.0 lb

## 2024-07-03 DIAGNOSIS — Z860100 Personal history of colon polyps, unspecified: Secondary | ICD-10-CM

## 2024-07-03 MED ORDER — SUTAB 1479-225-188 MG PO TABS: ORAL_TABLET | ORAL | 0 refills | Status: AC

## 2024-07-03 NOTE — Progress Notes (Signed)
 RN confirmed patient name, date of birth, and address RN confirmed date and time of procedure RN reviewed and confirmed allergies  RN reviewed and updated current medications; confirmed preferred pharmacy Pt is not on diet pills nor GLP-1 medications Pt is not on blood thinners RN reviewed medical & surgical hx  Pt denies issues with chronic constipation  Diabetic - *yes No A fib or A flutter No cardiac tests are pending  Pt is not on home 02  No issues known with past sedation with any surgeries or procedures Patient denies ever being told they had issues or difficulty with intubation  Patient unaware of any fh of malignant hyperthermia Ambulates independently RN reviewed prep instructions and explained time frames for holding certain medications RN answered patient questions; patient stated understanding Prep instructions sent

## 2024-07-17 ENCOUNTER — Encounter: Admitting: Gastroenterology
